# Patient Record
Sex: Male | Born: 1940 | Race: White | Hispanic: No | Marital: Married | Smoking: Never smoker
Health system: Southern US, Community
[De-identification: ages and names within clinical notes are randomized; demographics above are authoritative.]

## PROBLEM LIST (undated history)

## (undated) DIAGNOSIS — M1990 Unspecified osteoarthritis, unspecified site: Secondary | ICD-10-CM

## (undated) DIAGNOSIS — K219 Gastro-esophageal reflux disease without esophagitis: Secondary | ICD-10-CM

## (undated) DIAGNOSIS — I10 Essential (primary) hypertension: Secondary | ICD-10-CM

## (undated) HISTORY — PX: HERNIA REPAIR: SHX51

## (undated) HISTORY — PX: TONSILLECTOMY: SUR1361

---

## 2021-11-19 ENCOUNTER — Emergency Department (HOSPITAL_COMMUNITY): Payer: Medicare Other

## 2021-11-19 ENCOUNTER — Other Ambulatory Visit: Payer: Self-pay

## 2021-11-19 ENCOUNTER — Inpatient Hospital Stay (HOSPITAL_COMMUNITY)
Admission: EM | Admit: 2021-11-19 | Discharge: 2021-11-20 | DRG: 683 | Disposition: A | Discharge status: Home / Self Care | Payer: Medicare Other | Attending: Internal Medicine | Admitting: Internal Medicine

## 2021-11-19 ENCOUNTER — Encounter (HOSPITAL_COMMUNITY): Payer: Self-pay

## 2021-11-19 DIAGNOSIS — R112 Nausea with vomiting, unspecified: Secondary | ICD-10-CM | POA: Diagnosis present

## 2021-11-19 DIAGNOSIS — E861 Hypovolemia: Secondary | ICD-10-CM | POA: Diagnosis present

## 2021-11-19 DIAGNOSIS — N136 Pyonephrosis: Secondary | ICD-10-CM | POA: Diagnosis present

## 2021-11-19 DIAGNOSIS — N179 Acute kidney failure, unspecified: Principal | ICD-10-CM

## 2021-11-19 DIAGNOSIS — N320 Bladder-neck obstruction: Secondary | ICD-10-CM | POA: Diagnosis present

## 2021-11-19 DIAGNOSIS — Z79899 Other long term (current) drug therapy: Secondary | ICD-10-CM

## 2021-11-19 DIAGNOSIS — N138 Other obstructive and reflux uropathy: Secondary | ICD-10-CM | POA: Diagnosis present

## 2021-11-19 DIAGNOSIS — R109 Unspecified abdominal pain: Secondary | ICD-10-CM | POA: Diagnosis not present

## 2021-11-19 DIAGNOSIS — Z87440 Personal history of urinary (tract) infections: Secondary | ICD-10-CM

## 2021-11-19 DIAGNOSIS — K229 Disease of esophagus, unspecified: Secondary | ICD-10-CM

## 2021-11-19 DIAGNOSIS — A419 Sepsis, unspecified organism: Secondary | ICD-10-CM | POA: Diagnosis not present

## 2021-11-19 DIAGNOSIS — N390 Urinary tract infection, site not specified: Secondary | ICD-10-CM

## 2021-11-19 DIAGNOSIS — E871 Hypo-osmolality and hyponatremia: Secondary | ICD-10-CM | POA: Diagnosis present

## 2021-11-19 DIAGNOSIS — K209 Esophagitis, unspecified without bleeding: Secondary | ICD-10-CM | POA: Diagnosis not present

## 2021-11-19 DIAGNOSIS — K4090 Unilateral inguinal hernia, without obstruction or gangrene, not specified as recurrent: Secondary | ICD-10-CM | POA: Diagnosis present

## 2021-11-19 DIAGNOSIS — I10 Essential (primary) hypertension: Secondary | ICD-10-CM

## 2021-11-19 HISTORY — DX: Unspecified osteoarthritis, unspecified site: M19.90

## 2021-11-19 HISTORY — DX: Essential (primary) hypertension: I10

## 2021-11-19 LAB — CBC WITH DIFFERENTIAL/PLATELET
Abs Immature Granulocytes: 0.07 10*3/uL (ref 0.00–0.07)
Basophils Absolute: 0 10*3/uL (ref 0.0–0.1)
Basophils Relative: 0 %
Eosinophils Absolute: 0 10*3/uL (ref 0.0–0.5)
Eosinophils Relative: 0 %
HCT: 34.5 % — ABNORMAL LOW (ref 39.0–52.0)
Hemoglobin: 12 g/dL — ABNORMAL LOW (ref 13.0–17.0)
Immature Granulocytes: 1 %
Lymphocytes Relative: 3 %
Lymphs Abs: 0.4 10*3/uL — ABNORMAL LOW (ref 0.7–4.0)
MCH: 31.5 pg (ref 26.0–34.0)
MCHC: 34.8 g/dL (ref 30.0–36.0)
MCV: 90.6 fL (ref 80.0–100.0)
Monocytes Absolute: 0.7 10*3/uL (ref 0.1–1.0)
Monocytes Relative: 5 %
Neutro Abs: 12 10*3/uL — ABNORMAL HIGH (ref 1.7–7.7)
Neutrophils Relative %: 91 %
Platelets: 236 10*3/uL (ref 150–400)
RBC: 3.81 MIL/uL — ABNORMAL LOW (ref 4.22–5.81)
RDW: 12.4 % (ref 11.5–15.5)
WBC: 13.1 10*3/uL — ABNORMAL HIGH (ref 4.0–10.5)
nRBC: 0 % (ref 0.0–0.2)

## 2021-11-19 LAB — URINALYSIS, ROUTINE W REFLEX MICROSCOPIC
Bilirubin Urine: NEGATIVE
Glucose, UA: 50 mg/dL — AB
Ketones, ur: NEGATIVE mg/dL
Leukocytes,Ua: NEGATIVE
Nitrite: NEGATIVE
Protein, ur: NEGATIVE mg/dL
Specific Gravity, Urine: 1.008 (ref 1.005–1.030)
pH: 5 (ref 5.0–8.0)

## 2021-11-19 LAB — COMPREHENSIVE METABOLIC PANEL
ALT: 19 U/L (ref 0–44)
AST: 31 U/L (ref 15–41)
Albumin: 3.8 g/dL (ref 3.5–5.0)
Alkaline Phosphatase: 46 U/L (ref 38–126)
Anion gap: 18 — ABNORMAL HIGH (ref 5–15)
BUN: 80 mg/dL — ABNORMAL HIGH (ref 8–23)
CO2: 16 mmol/L — ABNORMAL LOW (ref 22–32)
Calcium: 8 mg/dL — ABNORMAL LOW (ref 8.9–10.3)
Chloride: 92 mmol/L — ABNORMAL LOW (ref 98–111)
Creatinine, Ser: 9.6 mg/dL — ABNORMAL HIGH (ref 0.61–1.24)
GFR, Estimated: 5 mL/min — ABNORMAL LOW (ref >60)
Glucose, Bld: 155 mg/dL — ABNORMAL HIGH (ref 70–99)
Potassium: 4 mmol/L (ref 3.5–5.1)
Sodium: 126 mmol/L — ABNORMAL LOW (ref 135–145)
Total Bilirubin: 0.8 mg/dL (ref 0.3–1.2)
Total Protein: 7.4 g/dL (ref 6.5–8.1)

## 2021-11-19 LAB — BASIC METABOLIC PANEL
Anion gap: 12 (ref 5–15)
BUN: 69 mg/dL — ABNORMAL HIGH (ref 8–23)
CO2: 22 mmol/L (ref 22–32)
Calcium: 8.2 mg/dL — ABNORMAL LOW (ref 8.9–10.3)
Chloride: 102 mmol/L (ref 98–111)
Creatinine, Ser: 7.72 mg/dL — ABNORMAL HIGH (ref 0.61–1.24)
GFR, Estimated: 7 mL/min — ABNORMAL LOW (ref >60)
Glucose, Bld: 93 mg/dL (ref 70–99)
Potassium: 4.1 mmol/L (ref 3.5–5.1)
Sodium: 136 mmol/L (ref 135–145)

## 2021-11-19 LAB — LIPASE, BLOOD: Lipase: 24 U/L (ref 11–51)

## 2021-11-19 MED ORDER — SODIUM CHLORIDE 0.9 % IV SOLN
1.0000 g | INTRAVENOUS | Status: DC
  Administered 2021-11-19: 1 g via INTRAVENOUS
  Filled 2021-11-19: qty 10

## 2021-11-19 MED ORDER — ACETAMINOPHEN 650 MG RE SUPP: 650.0000 mg | Freq: Four times a day (QID) | RECTAL | Status: DC | PRN

## 2021-11-19 MED ORDER — SODIUM CHLORIDE 0.9 % IV BOLUS
500.0000 mL | Freq: Once | INTRAVENOUS | Status: AC
  Administered 2021-11-19: 500 mL via INTRAVENOUS

## 2021-11-19 MED ORDER — HEPARIN SODIUM (PORCINE) 5000 UNIT/ML IJ SOLN
5000.0000 [IU] | Freq: Three times a day (TID) | INTRAMUSCULAR | Status: DC
  Administered 2021-11-19 – 2021-11-20 (×3): 5000 [IU] via SUBCUTANEOUS
  Filled 2021-11-19 (×2): qty 1

## 2021-11-19 MED ORDER — ENOXAPARIN SODIUM 40 MG/0.4ML IJ SOSY: 40.0000 mg | PREFILLED_SYRINGE | INTRAMUSCULAR | Status: DC

## 2021-11-19 MED ORDER — ONDANSETRON HCL 4 MG PO TABS: 4.0000 mg | ORAL_TABLET | Freq: Four times a day (QID) | ORAL | Status: DC | PRN

## 2021-11-19 MED ORDER — LISINOPRIL 10 MG PO TABS: 10.0000 mg | ORAL_TABLET | Freq: Every day | ORAL | Status: DC

## 2021-11-19 MED ORDER — ACETAMINOPHEN 325 MG PO TABS
650.0000 mg | ORAL_TABLET | Freq: Four times a day (QID) | ORAL | Status: DC | PRN
  Administered 2021-11-19: 650 mg via ORAL
  Filled 2021-11-19: qty 2

## 2021-11-19 MED ORDER — ONDANSETRON HCL 4 MG/2ML IJ SOLN
4.0000 mg | Freq: Once | INTRAMUSCULAR | Status: AC
  Administered 2021-11-19: 4 mg via INTRAVENOUS
  Filled 2021-11-19: qty 2

## 2021-11-19 MED ORDER — PANTOPRAZOLE SODIUM 40 MG PO TBEC
40.0000 mg | DELAYED_RELEASE_TABLET | Freq: Every day | ORAL | Status: DC
  Administered 2021-11-19 – 2021-11-20 (×2): 40 mg via ORAL
  Filled 2021-11-19 (×2): qty 1

## 2021-11-19 MED ORDER — IOHEXOL 9 MG/ML PO SOLN
ORAL | Status: AC
  Filled 2021-11-19: qty 1000

## 2021-11-19 MED ORDER — ONDANSETRON HCL 4 MG/2ML IJ SOLN: 4.0000 mg | Freq: Four times a day (QID) | INTRAMUSCULAR | Status: DC | PRN

## 2021-11-19 MED ORDER — ADULT MULTIVITAMIN W/MINERALS CH
1.0000 | ORAL_TABLET | Freq: Every day | ORAL | Status: DC
  Administered 2021-11-19 – 2021-11-20 (×2): 1 via ORAL
  Filled 2021-11-19 (×2): qty 1

## 2021-11-19 MED ORDER — SODIUM CHLORIDE 0.9 % IV SOLN: INTRAVENOUS | Status: DC

## 2021-11-19 NOTE — Assessment & Plan Note (Signed)
Complicated urinary tract infection.  Positive sepsis criteria on admission.   Patient has been taking bactrim at home.   Plan to continue antibiotic therapy with IV ceftriaxone. Follow up cell count, temperature curve and cultures.

## 2021-11-19 NOTE — ED Notes (Signed)
Patient educated on need to drink contrast at this time.

## 2021-11-19 NOTE — Progress Notes (Addendum)
Patient's foreskin is stuck behind glans of penis after cleaning d/t foley. RN x 2 attempted to reposition and was unsuccessful. Urology paged and coming to bedside.

## 2021-11-19 NOTE — ED Notes (Signed)
Attempted to call report x1. Nurse stated she would call back for report.

## 2021-11-20 DIAGNOSIS — N179 Acute kidney failure, unspecified: Secondary | ICD-10-CM | POA: Diagnosis not present

## 2021-11-20 LAB — BASIC METABOLIC PANEL
Anion gap: 8 (ref 5–15)
BUN: 50 mg/dL — ABNORMAL HIGH (ref 8–23)
CO2: 23 mmol/L (ref 22–32)
Calcium: 8.3 mg/dL — ABNORMAL LOW (ref 8.9–10.3)
Chloride: 108 mmol/L (ref 98–111)
Creatinine, Ser: 4.87 mg/dL — ABNORMAL HIGH (ref 0.61–1.24)
GFR, Estimated: 11 mL/min — ABNORMAL LOW (ref >60)
Glucose, Bld: 77 mg/dL (ref 70–99)
Potassium: 4.2 mmol/L (ref 3.5–5.1)
Sodium: 139 mmol/L (ref 135–145)

## 2021-11-20 LAB — CBC
HCT: 34.9 % — ABNORMAL LOW (ref 39.0–52.0)
Hemoglobin: 11.9 g/dL — ABNORMAL LOW (ref 13.0–17.0)
MCH: 31.4 pg (ref 26.0–34.0)
MCHC: 34.1 g/dL (ref 30.0–36.0)
MCV: 92.1 fL (ref 80.0–100.0)
Platelets: 267 10*3/uL (ref 150–400)
RBC: 3.79 MIL/uL — ABNORMAL LOW (ref 4.22–5.81)
RDW: 12.9 % (ref 11.5–15.5)
WBC: 13.4 10*3/uL — ABNORMAL HIGH (ref 4.0–10.5)
nRBC: 0 % (ref 0.0–0.2)

## 2021-11-20 MED ORDER — GUAIFENESIN 100 MG/5ML PO LIQD: 5.0000 mL | ORAL | Status: DC | PRN

## 2021-11-20 MED ORDER — TAMSULOSIN HCL 0.4 MG PO CAPS: 0.4000 mg | ORAL_CAPSULE | Freq: Every day | ORAL | 0 refills | Status: DC

## 2021-11-20 MED ORDER — METOPROLOL TARTRATE 5 MG/5ML IV SOLN: 5.0000 mg | INTRAVENOUS | Status: DC | PRN

## 2021-11-20 MED ORDER — HYDRALAZINE HCL 20 MG/ML IJ SOLN: 10.0000 mg | INTRAMUSCULAR | Status: DC | PRN

## 2021-11-20 MED ORDER — CHLORHEXIDINE GLUCONATE CLOTH 2 % EX PADS
6.0000 | MEDICATED_PAD | Freq: Every day | CUTANEOUS | Status: DC
  Administered 2021-11-20: 6 via TOPICAL

## 2021-11-20 MED ORDER — IPRATROPIUM-ALBUTEROL 0.5-2.5 (3) MG/3ML IN SOLN: 3.0000 mL | RESPIRATORY_TRACT | Status: DC | PRN

## 2021-11-20 MED ORDER — PANTOPRAZOLE SODIUM 40 MG PO TBEC: 40.0000 mg | DELAYED_RELEASE_TABLET | Freq: Every day | ORAL | 0 refills | Status: DC

## 2021-11-20 MED ORDER — TRAZODONE HCL 50 MG PO TABS: 50.0000 mg | ORAL_TABLET | Freq: Every evening | ORAL | Status: DC | PRN

## 2021-11-20 MED ORDER — SENNOSIDES-DOCUSATE SODIUM 8.6-50 MG PO TABS: 1.0000 | ORAL_TABLET | Freq: Every evening | ORAL | Status: DC | PRN

## 2021-11-20 MED ORDER — CIPROFLOXACIN HCL 500 MG PO TABS: 500.0000 mg | ORAL_TABLET | Freq: Two times a day (BID) | ORAL | 0 refills | Status: AC

## 2021-11-21 LAB — URINE CULTURE: Culture: NO GROWTH

## 2021-11-25 ENCOUNTER — Ambulatory Visit (INDEPENDENT_AMBULATORY_CARE_PROVIDER_SITE_OTHER): Payer: Medicare Other | Admitting: Physician Assistant

## 2021-11-25 VITALS — BP 124/83 | HR 109

## 2021-11-25 DIAGNOSIS — R3912 Poor urinary stream: Secondary | ICD-10-CM | POA: Diagnosis not present

## 2021-11-25 DIAGNOSIS — R339 Retention of urine, unspecified: Secondary | ICD-10-CM

## 2021-11-25 DIAGNOSIS — N179 Acute kidney failure, unspecified: Secondary | ICD-10-CM

## 2021-11-25 MED ORDER — SILODOSIN 8 MG PO CAPS: 8.0000 mg | ORAL_CAPSULE | Freq: Every day | ORAL | 0 refills | Status: DC

## 2021-11-25 NOTE — Progress Notes (Signed)
11/25/2021 10:43 AM   James Vazquez [birth year] [id number]   Assessment:  1. Urinary retention  2. AKI (acute kidney injury) (HCC)  3. Weak urine stream    Plan: Patient return to the office mid afternoon following voiding trial and has been unable to void.  Foley replaced and prescription for Rapaflo sent to his pharmacy.  We will discontinue the tamsulosin at this time and start silodosin daily.  Follow-up next week for reattempt at voiding trial.  Possible cystoscopy BMP has been ordered by the patient's primary care provider with whom he follows up next week  Chief Complaint: No chief complaint on file.   Referring provider: Alvina Filbert, MD [address] [place],  [place] [address]   History of Present Illness:  James Vazquez is a 81 y.o. year old male with history of hypertension and no other reported medical problems who is seen in consultation from James Filbert, MD for voiding trial and evaluation of acute urinary retention with resulting acute renal failure as diagnosed in the emergency department on 11/19/2021. Foley was placed with >1L of urine noted in bladder and pt was admitted. Urine cx on 6/23-no growth and CT showed B hydro with bladder wall thickening. Consult visit with Dr. Ronne Binning and pt placed on Flomax. The pt denies h/o urinary c/o except some weakness of stream and intermittency with an increase in frequency for the last 4 months.  Nocturia 0-3 times.  He has no history of stone disease, states he has never been diagnosed with BPH, has no history of UTIs and denies hematuria.  Follow-up visit is scheduled with his primary care provider on July 10 and BMP scheduled. DC Creatinine 4.87, down from 9.6 at admission. Pt remains on Cipro as Rx at hospital DC.  11/20/21 81 year old male with history of essential hypertension, osteoarthritis admitted for nausea vomiting and abdominal pain.  He was diagnosed with UTI on 6/19.  Upon admission he was noted to  have urinary retention of about 1 L and acute kidney injury with creatinine of 9.6.  Foley catheter was placed.  He was also hyponatremic.  IV fluids were started.  Over the course of 24 hours renal function significantly started improving and this morning it was 4.87.  He was seen by urology and cleared for discharge with Foley catheter and recommendations as stated above.    Portions of the above documentation were copied from a prior visit for review purposes only  Past Medical History:  Past Medical History:  Diagnosis Date   Arthritis    Hypertension     Past Surgical History:  Past Surgical History:  Procedure Laterality Date   HERNIA REPAIR     TONSILLECTOMY      Allergies:  No Known Allergies  Family History:  No family history on file.  Social History:  Social History   Tobacco Use   Smoking status: Never   Smokeless tobacco: Never  Vaping Use   Vaping Use: Never used  Substance Use Topics   Alcohol use: Never   Drug use: Never    Review of symptoms:  Constitutional:  Negative for unexplained weight loss, night sweats, fever, chills ENT:  Negative for nose bleeds, sinus pain, painful swallowing CV:  Negative for chest pain, shortness of breath, exercise intolerance, palpitations, loss of consciousness Resp:  Negative for cough, wheezing, shortness of breath GI:  Negative for nausea, vomiting, diarrhea, bloody stools GU:  Positives noted in HPI; otherwise negative for gross hematuria,  dysuria, urinary incontinence Neuro:  Negative for seizures, poor balance, limb weakness, slurred speech Psych:  Negative for lack of energy, depression, anxiety Endocrine:  Negative for polydipsia, polyuria, symptoms of hypoglycemia (dizziness, hunger, sweating) Hematologic:  Negative for anemia, purpura, petechia, prolonged or excessive bleeding, use of anticoagulants   Physical Exam: BP 124/83   Pulse (!) 109   Constitutional:  Alert and oriented, No acute distress. HEENT:  NCAT, moist mucus membranes.  Trachea midline, no masses. Cardiovascular: Regular rate and rhythm without murmur, rub, or gallops No clubbing, cyanosis, or edema. Respiratory: Normal respiratory effort, clear to auscultation bilaterally GI: Abdomen is soft, nontender, nondistended, no abdominal masses BACK:  Non-tender to palpation.  No CVAT Skin: No obvious rashes, warm, dry, intact Neurologic: Alert and oriented, Cranial nerves grossly intact, no focal deficits, moving all 4 extremities. Psychiatric: Appropriate. Normal mood and affect.  Laboratory Data: No results found for this or any previous visit (from the past 24 hour(s)).  Lab Results  Component Value Date   WBC 13.4 (H) 11/20/2021   HGB 11.9 (L) 11/20/2021   HCT 34.9 (L) 11/20/2021   MCV 92.1 11/20/2021   PLT 267 11/20/2021    Lab Results  Component Value Date   CREATININE 4.87 (H) 11/20/2021    Urinalysis    Component Value Date/Time   COLORURINE YELLOW 11/19/2021 0800   APPEARANCEUR CLEAR 11/19/2021 0800   LABSPEC 1.008 11/19/2021 0800   PHURINE 5.0 11/19/2021 0800   GLUCOSEU 50 (A) 11/19/2021 0800   HGBUR SMALL (A) 11/19/2021 0800   BILIRUBINUR NEGATIVE 11/19/2021 0800   KETONESUR NEGATIVE 11/19/2021 0800   PROTEINUR NEGATIVE 11/19/2021 0800   NITRITE NEGATIVE 11/19/2021 0800   LEUKOCYTESUR NEGATIVE 11/19/2021 0800    Lab Results  Component Value Date   BACTERIA RARE (A) 11/19/2021    Pertinent Imaging: No results found for this or any previous visit.  No results found for this or any previous visit.     [place], Regan Rakers, PA-C [facility]

## 2021-11-25 NOTE — Progress Notes (Signed)
Fill and Pull Catheter Removal  Patient is present today for a catheter removal.  Patient was cleaned and prepped in a sterile fashion of sterile water/ saline was instilled into the bladder when the patient felt the urge to urinate. 24ml of water was then drained from the balloon.  A 16FR foley cath was removed from the bladder no complications were noted .  Patient as then given some time to void on their own.  Patient cannot void  59ml on their own after some time.  Patient tolerated well.  Performed by: Marchelle Folks RN  Follow up/ Additional notes: PA to see- patient will come back later for PVR

## 2021-12-01 ENCOUNTER — Ambulatory Visit (INDEPENDENT_AMBULATORY_CARE_PROVIDER_SITE_OTHER): Payer: Medicare Other | Admitting: Physician Assistant

## 2021-12-01 VITALS — BP 144/81 | HR 111 | Ht 72.0 in | Wt 160.0 lb

## 2021-12-01 DIAGNOSIS — R339 Retention of urine, unspecified: Secondary | ICD-10-CM

## 2021-12-01 DIAGNOSIS — N179 Acute kidney failure, unspecified: Secondary | ICD-10-CM | POA: Diagnosis not present

## 2021-12-01 LAB — BLADDER SCAN AMB NON-IMAGING: Scan Result: 904

## 2021-12-01 NOTE — Progress Notes (Signed)
Assessment: 1. Urinary retention  2. AKI (acute kidney injury) (HCC)    Plan: As the patient was unable to void during office visit, he is encouraged to increase his fluid intake and attempt to void throughout the morning.  He will follow-up in the office around 2 this afternoon if he continues to have voiding issues.  Follow-up next week for cystoscopic exam regardless of today's outcome.  He will continue silodosin. FU BMP scheduled   Chief Complaint: No chief complaint on file.   HPI: James Vazquez is a 81 y.o. male who presents for continued evaluation of acute urinary retention with renal injury diagnosed at hospital admission on 11/19/2021. The patient was seen last week for voiding trial and was unable to void by mid afternoon following procedure.  Foley was reinserted and he presents today for further attempt.  He has been taking Rapaflo daily since last visit.  Follow-up BMP scheduled with his primary care provider  11/25/21 James Vazquez is a 81 y.o. year old male with history of hypertension and no other reported medical problems who is seen in consultation from Alvina Filbert, MD for voiding trial and evaluation of acute urinary retention with resulting acute renal failure as diagnosed in the emergency department on 11/19/2021. Foley was placed with >1L of urine noted in bladder and pt was admitted. Urine cx on 6/23-no growth and CT showed B hydro with bladder wall thickening. Consult visit with Dr. Ronne Binning and pt placed on Flomax. The pt denies h/o urinary c/o except some weakness of stream and intermittency with an increase in frequency for the last 4 months.  Nocturia 0-3 times.  He has no history of stone disease, states he has never been diagnosed with BPH, has no history of UTIs and denies hematuria.  Follow-up visit is scheduled with his primary care provider on July 10 and BMP scheduled. DC Creatinine 4.87, down from 9.6 at admission. Pt remains on Cipro as Rx at hospital  DC.   11/20/21 Hospital consult: 81 year old male with history of essential hypertension, osteoarthritis admitted for nausea vomiting and abdominal pain.  He was diagnosed with UTI on 6/19.  Upon admission he was noted to have urinary retention of about 1 L and acute kidney injury with creatinine of 9.6.  Foley catheter was placed.  He was also hyponatremic.  IV fluids were started.  Over the course of 24 hours renal function significantly started improving and this morning it was 4.87.  He was seen by urology and cleared for discharge with Foley catheter and recommendations as stated above.    Portions of the above documentation were copied from a prior visit for review purposes only.  Allergies: No Known Allergies  PMH: Past Medical History:  Diagnosis Date   Arthritis    Hypertension     PSH: Past Surgical History:  Procedure Laterality Date   HERNIA REPAIR     TONSILLECTOMY      SH: Social History   Tobacco Use   Smoking status: Never   Smokeless tobacco: Never  Vaping Use   Vaping Use: Never used  Substance Use Topics   Alcohol use: Never   Drug use: Never    ROS: All other review of systems were reviewed and are negative except what is noted above in HPI  PE: BP (!) 144/81   Pulse (!) 111   Ht 6' (1.829 m)   Wt 160 lb (72.6 kg)   BMI 21.70 kg/m  GENERAL APPEARANCE:  Well  appearing, well developed, well nourished, NAD HEENT:  Atraumatic, normocephalic NECK:  Supple. Trachea midline ABDOMEN:  Soft, non-tender, no masses EXTREMITIES:  Moves all extremities well NEUROLOGIC:  Alert and oriented x 3, normal gait, CN II-XII grossly intact MENTAL STATUS:  appropriate BACK:  Non-tender to palpation, No CVAT SKIN:  Warm, dry, and intact   Results: Laboratory Data: Lab Results  Component Value Date   WBC 13.4 (H) 11/20/2021   HGB 11.9 (L) 11/20/2021   HCT 34.9 (L) 11/20/2021   MCV 92.1 11/20/2021   PLT 267 11/20/2021    Lab Results  Component Value  Date   CREATININE 4.87 (H) 11/20/2021    Urinalysis    Component Value Date/Time   COLORURINE YELLOW 11/19/2021 0800   APPEARANCEUR CLEAR 11/19/2021 0800   LABSPEC 1.008 11/19/2021 0800   PHURINE 5.0 11/19/2021 0800   GLUCOSEU 50 (A) 11/19/2021 0800   HGBUR SMALL (A) 11/19/2021 0800   BILIRUBINUR NEGATIVE 11/19/2021 0800   KETONESUR NEGATIVE 11/19/2021 0800   PROTEINUR NEGATIVE 11/19/2021 0800   NITRITE NEGATIVE 11/19/2021 0800   LEUKOCYTESUR NEGATIVE 11/19/2021 0800    Lab Results  Component Value Date   BACTERIA RARE (A) 11/19/2021    Pertinent Imaging: No results found for this or any previous visit.  No results found for this or any previous visit.  No results found for this or any previous visit.  No results found for this or any previous visit.  No results found for this or any previous visit.  No results found for this or any previous visit.  No results found for this or any previous visit.  No results found for this or any previous visit.  No results found for this or any previous visit (from the past 24 hour(s)).

## 2021-12-01 NOTE — Progress Notes (Signed)
Fill and Pull Catheter Removal  Patient is present today for a catheter removal.  Patient was cleaned and prepped in a sterile fashion of sterile water/ saline was instilled into the bladder when the patient felt the urge to urinate. 67ml of water was then drained from the balloon.  A 16FR foley cath was removed from the bladder no complications were noted .  Patient as then given some time to void on their own.  Patient cannot void  29ml on their own after some time.  Patient tolerated well.  Performed by: Guss Bunde, CMA  Follow up/ Additional notes: Return for PVR later today.   post void residual =965mL  Simple Catheter Placement  Due to urinary retention patient is present today for a foley cath placement.  Patient was cleaned and prepped in a sterile fashion with betadine. A 16 FR foley catheter was inserted, urine return was noted  , urine was yellow in color.  The balloon was filled with 10cc of sterile water.  A leg bag was attached for drainage. Patient was also given a night bag to take home and was given instruction on how to change from one bag to another.  Patient was given instruction on proper catheter care.  Patient tolerated well, no complications were noted   Performed by: Guss Bunde, CMA  Additional notes/ Follow up: Follow up with MD

## 2021-12-08 ENCOUNTER — Encounter: Payer: Self-pay | Admitting: Urology

## 2021-12-08 ENCOUNTER — Ambulatory Visit (INDEPENDENT_AMBULATORY_CARE_PROVIDER_SITE_OTHER): Payer: Medicare Other | Admitting: Urology

## 2021-12-08 VITALS — BP 132/63 | HR 96

## 2021-12-08 DIAGNOSIS — R339 Retention of urine, unspecified: Secondary | ICD-10-CM | POA: Diagnosis not present

## 2021-12-08 MED ORDER — CIPROFLOXACIN HCL 500 MG PO TABS: 500.0000 mg | ORAL_TABLET | Freq: Once | ORAL | Status: DC

## 2021-12-08 NOTE — H&P (View-Only) (Signed)
   12/08/21  CC: urinary retention   HPI: James Vazquez is a 80yo here for followup for urinary retention Blood pressure 132/63, pulse 96. NED. A&Ox3.   No respiratory distress   Abd soft, NT, ND Normal phallus with bilateral descended testicles  Cystoscopy Procedure Note  Patient identification was confirmed, informed consent was obtained, and patient was prepped using Betadine solution.  Lidocaine jelly was administered per urethral meatus.     Pre-Procedure: - Inspection reveals a normal caliber ureteral meatus.  Procedure: The flexible cystoscope was introduced without difficulty - No urethral strictures/lesions are present. 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 After discussing the options the patient has elected to proceed with Urolift. Risks/benefits/alternatives discussed.   No follow-ups on file.  James Vrooman, MD  

## 2021-12-08 NOTE — Progress Notes (Signed)
   12/08/21  CC: urinary retention   HPI: James Vazquez is a 80yo here for followup for urinary retention Blood pressure 132/63, pulse 96. NED. A&Ox3.   No respiratory distress   Abd soft, NT, ND Normal phallus with bilateral descended testicles  Cystoscopy Procedure Note  Patient identification was confirmed, informed consent was obtained, and patient was prepped using Betadine solution.  Lidocaine jelly was administered per urethral meatus.     Pre-Procedure: - Inspection reveals a normal caliber ureteral meatus.  Procedure: The flexible cystoscope was introduced without difficulty - No urethral strictures/lesions are present. 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 After discussing the options the patient has elected to proceed with Urolift. Risks/benefits/alternatives discussed.   No follow-ups on file.  James Aye, MD

## 2021-12-08 NOTE — Patient Instructions (Signed)

## 2021-12-20 NOTE — Patient Instructions (Signed)
Your procedure is scheduled on: 7/312023  Report to [facility] at  7:15   AM.  Call this number if you have problems the morning of surgery: [phone]   Remember:   Do not Eat or Drink after midnight         No Smoking the morning of surgery  :  Take these medicines the morning of surgery with A SIP OF WATER: Pantoprazole, and rapaflo   Do not wear jewelry, make-up or nail polish.  Do not wear lotions, powders, or perfumes. You may wear deodorant.  Do not shave 48 hours prior to surgery. Men may shave face and neck.  Do not bring valuables to the hospital.  Contacts, dentures or bridgework may not be worn into surgery.  Leave suitcase in the car. After surgery it may be brought to your room.  For patients admitted to the hospital, checkout time is 11:00 AM the day of discharge.   Patients discharged the day of surgery will not be allowed to drive home.    Special Instructions: Shower using CHG night before surgery and shower the day of surgery use CHG.  Use special wash - you have one bottle of CHG for all showers.  You should use approximately 1/2 of the bottle for each shower.  How to Use Chlorhexidine for Bathing Chlorhexidine gluconate (CHG) is a germ-killing (antiseptic) solution that is used to clean the skin. It can get rid of the bacteria that normally live on the skin and can keep them away for about 24 hours. To clean your skin with CHG, you may be given: A CHG solution to use in the shower or as part of a sponge bath. A prepackaged cloth that contains CHG. Cleaning your skin with CHG may help lower the risk for infection: While you are staying in the intensive care unit of the hospital. If you have a vascular access, such as a central line, to provide short-term or long-term access to your veins. If you have a catheter to drain urine from your bladder. If you are on a ventilator. A ventilator is a machine that helps you breathe by moving air in and out of  your lungs. After surgery. What are the risks? Risks of using CHG include: A skin reaction. Hearing loss, if CHG gets in your ears and you have a perforated eardrum. Eye injury, if CHG gets in your eyes and is not rinsed out. The CHG product catching fire. Make sure that you avoid smoking and flames after applying CHG to your skin. Do not use CHG: If you have a chlorhexidine allergy or have previously reacted to chlorhexidine. On babies younger than 39 months of age. How to use CHG solution Use CHG only as told by your health care provider, and follow the instructions on the label. Use the full amount of CHG as directed. Usually, this is one bottle. During a shower Follow these steps when using CHG solution during a shower (unless your health care provider gives you different instructions): Start the shower. Use your normal soap and shampoo to wash your face and hair. Turn off the shower or move out of the shower stream. Pour the CHG onto a clean washcloth. Do not use any type of brush or rough-edged sponge. Starting at your neck, lather your body down to your toes. Make sure you follow these instructions: If you will be having surgery, pay special attention to the part of your body where you will be having  surgery. Scrub this area for at least 1 minute. Do not use CHG on your head or face. If the solution gets into your ears or eyes, rinse them well with water. Avoid your genital area. Avoid any areas of skin that have broken skin, cuts, or scrapes. Scrub your back and under your arms. Make sure to wash skin folds. Let the lather sit on your skin for 1-2 minutes or as long as told by your health care provider. Thoroughly rinse your entire body in the shower. Make sure that all body creases and crevices are rinsed well. Dry off with a clean towel. Do not put any substances on your body afterward--such as powder, lotion, or perfume--unless you are told to do so by your health care  provider. Only use lotions that are recommended by the manufacturer. Put on clean clothes or pajamas. If it is the night before your surgery, sleep in clean sheets.  During a sponge bath Follow these steps when using CHG solution during a sponge bath (unless your health care provider gives you different instructions): Use your normal soap and shampoo to wash your face and hair. Pour the CHG onto a clean washcloth. Starting at your neck, lather your body down to your toes. Make sure you follow these instructions: If you will be having surgery, pay special attention to the part of your body where you will be having surgery. Scrub this area for at least 1 minute. Do not use CHG on your head or face. If the solution gets into your ears or eyes, rinse them well with water. Avoid your genital area. Avoid any areas of skin that have broken skin, cuts, or scrapes. Scrub your back and under your arms. Make sure to wash skin folds. Let the lather sit on your skin for 1-2 minutes or as long as told by your health care provider. Using a different clean, wet washcloth, thoroughly rinse your entire body. Make sure that all body creases and crevices are rinsed well. Dry off with a clean towel. Do not put any substances on your body afterward--such as powder, lotion, or perfume--unless you are told to do so by your health care provider. Only use lotions that are recommended by the manufacturer. Put on clean clothes or pajamas. If it is the night before your surgery, sleep in clean sheets. How to use CHG prepackaged cloths Only use CHG cloths as told by your health care provider, and follow the instructions on the label. Use the CHG cloth on clean, dry skin. Do not use the CHG cloth on your head or face unless your health care provider tells you to. When washing with the CHG cloth: Avoid your genital area. Avoid any areas of skin that have broken skin, cuts, or scrapes. Before surgery Follow these steps  when using a CHG cloth to clean before surgery (unless your health care provider gives you different instructions): Using the CHG cloth, vigorously scrub the part of your body where you will be having surgery. Scrub using a back-and-forth motion for 3 minutes. The area on your body should be completely wet with CHG when you are done scrubbing. Do not rinse. Discard the cloth and let the area air-dry. Do not put any substances on the area afterward, such as powder, lotion, or perfume. Put on clean clothes or pajamas. If it is the night before your surgery, sleep in clean sheets.  For general bathing Follow these steps when using CHG cloths for general bathing (unless your health  care provider gives you different instructions). Use a separate CHG cloth for each area of your body. Make sure you wash between any folds of skin and between your fingers and toes. Wash your body in the following order, switching to a new cloth after each step: The front of your neck, shoulders, and chest. Both of your arms, under your arms, and your hands. Your stomach and groin area, avoiding the genitals. Your right leg and foot. Your left leg and foot. The back of your neck, your back, and your buttocks. Do not rinse. Discard the cloth and let the area air-dry. Do not put any substances on your body afterward--such as powder, lotion, or perfume--unless you are told to do so by your health care provider. Only use lotions that are recommended by the manufacturer. Put on clean clothes or pajamas. Contact a health care provider if: Your skin gets irritated after scrubbing. You have questions about using your solution or cloth. You swallow any chlorhexidine. Call your local poison control center [phone] [phone] in the [place] Get help right away if: Your eyes itch badly, or they become very red or swollen. Your skin itches badly and is red or swollen. Your hearing changes. You have trouble seeing. You have swelling or  tingling in your mouth or throat. You have trouble breathing. These symptoms may represent a serious problem that is an emergency. Do not wait to see if the symptoms will go away. Get medical help right away. Call your local emergency services (911 in the [place] Do not drive yourself to the hospital. Summary Chlorhexidine gluconate (CHG) is a germ-killing (antiseptic) solution that is used to clean the skin. Cleaning your skin with CHG may help to lower your risk for infection. You may be given CHG to use for bathing. It may be in a bottle or in a prepackaged cloth to use on your skin. Carefully follow your health care provider's instructions and the instructions on the product label. Do not use CHG if you have a chlorhexidine allergy. Contact your health care provider if your skin gets irritated after scrubbing. This information is not intended to replace advice given to you by your health care provider. Make sure you discuss any questions you have with your health care provider. Document Revised: 07/27/2020 Document Reviewed: 07/27/2020 Elsevier Patient Education  2023 [employer] Prostatic Urethral Lift, Care After The following information offers guidance on how to care for yourself after your procedure. Your health care provider may also give you more specific instructions. If you have problems or questions, contact your health care provider. What can I expect after the procedure? After the procedure, it is common to have: Soreness or discomfort in your penis from having the cystoscope inserted during the procedure. Discomfort or burning when urinating. An increased urge to urinate. More frequent urination. Urine that is blood-tinged. These symptoms should go away after a few days. Follow these instructions at home: Activity  If you were given a sedative during the procedure, it can affect you for several hours. Do not drive or operate machinery until your health care provider says  that it is safe. Avoid sitting for a long time without moving. Get up to take short walks every 1-2 hours. This is important to improve blood flow and breathing. Ask for help if you feel weak or unsteady. You may have to avoid lifting. Ask your health care provider how much you can safely lift. Avoid intense physical activity for as long as told by your  health care provider. Return to your normal activities as told by your health care provider. Ask your health care provider what activities are safe for you. Ask when you can return to sexual activity. General instructions  Take over-the-counter and prescription medicines only as told by your health care provider. Ask your health care provider if the medicine prescribed to you: Requires you to avoid driving or using machinery. Can cause constipation. You may need to take these actions to prevent or treat constipation: Drink enough fluid to keep your urine pale yellow. Take over-the-counter or prescription medicines. Eat foods that are high in fiber, such as beans, whole grains, and fresh fruits and vegetables. Limit foods that are high in fat and processed sugars, such as fried or sweet foods. Do not use any products that contain nicotine or tobacco. These products include cigarettes, chewing tobacco, and vaping devices, such as e-cigarettes. These can delay healing after the procedure. If you need help quitting, ask your health care provider. Keep all follow-up visits. This is important. Contact a health care provider if: You have chills or a fever. You have pain when passing urine. You have bright red blood or blood clots in your urine. You have difficulty passing urine. You have leaking of urine (incontinence). Get help right away if: You have chest pain or shortness of breath. You have leg pain or swelling. You cannot pass urine. These symptoms may be an emergency. Get help right away. Call 911. Do not wait to see if the symptoms will  go away. Do not drive yourself to the hospital. Summary After the procedure, it is common to have discomfort or burning when urinating, an increased urge to urinate, more frequent urination, and urine that is blood-tinged. You may have to avoid lifting. Ask your health care provider how much you can safely lift. Return to your normal activities as told by your health care provider. Ask when you can return to sexual activity. This information is not intended to replace advice given to you by your health care provider. Make sure you discuss any questions you have with your health care provider. Document Revised: 12/11/2020 Document Reviewed: 12/11/2020 Elsevier Patient Education  2023 [employer]. General Anesthesia, Adult, Care After This sheet gives you information about how to care for yourself after your procedure. Your health care provider may also give you more specific instructions. If you have problems or questions, contact your health care provider. What can I expect after the procedure? After the procedure, the following side effects are common: Pain or discomfort at the IV site. Nausea. Vomiting. Sore throat. Trouble concentrating. Feeling cold or chills. Feeling weak or tired. Sleepiness and fatigue. Soreness and body aches. These side effects can affect parts of the body that were not involved in surgery. Follow these instructions at home: For the time period you were told by your health care provider:  Rest. Do not participate in activities where you could fall or become injured. Do not drive or use machinery. Do not drink alcohol. Do not take sleeping pills or medicines that cause drowsiness. Do not make important decisions or sign legal documents. Do not take care of children on your own. Eating and drinking Follow any instructions from your health care provider about eating or drinking restrictions. When you feel hungry, start by eating small amounts of foods that  are soft and easy to digest (bland), such as toast. Gradually return to your regular diet. Drink enough fluid to keep your urine pale yellow.  If you vomit, rehydrate by drinking water, juice, or clear broth. General instructions If you have sleep apnea, surgery and certain medicines can increase your risk for breathing problems. Follow instructions from your health care provider about wearing your sleep device: Anytime you are sleeping, including during daytime naps. While taking prescription pain medicines, sleeping medicines, or medicines that make you drowsy. Have a responsible adult stay with you for the time you are told. It is important to have someone help care for you until you are awake and alert. Return to your normal activities as told by your health care provider. Ask your health care provider what activities are safe for you. Take over-the-counter and prescription medicines only as told by your health care provider. If you smoke, do not smoke without supervision. Keep all follow-up visits as told by your health care provider. This is important. Contact a health care provider if: You have nausea or vomiting that does not get better with medicine. You cannot eat or drink without vomiting. You have pain that does not get better with medicine. You are unable to pass urine. You develop a skin rash. You have a fever. You have redness around your IV site that gets worse. Get help right away if: You have difficulty breathing. You have chest pain. You have blood in your urine or stool, or you vomit blood. Summary After the procedure, it is common to have a sore throat or nausea. It is also common to feel tired. Have a responsible adult stay with you for the time you are told. It is important to have someone help care for you until you are awake and alert. When you feel hungry, start by eating small amounts of foods that are soft and easy to digest (bland), such as toast. Gradually  return to your regular diet. Drink enough fluid to keep your urine pale yellow. Return to your normal activities as told by your health care provider. Ask your health care provider what activities are safe for you. This information is not intended to replace advice given to you by your health care provider. Make sure you discuss any questions you have with your health care provider. Document Revised: 01/30/2020 Document Reviewed: 08/29/2019 Elsevier Patient Education  [place].

## 2021-12-22 ENCOUNTER — Encounter (HOSPITAL_COMMUNITY): Payer: Self-pay

## 2021-12-22 ENCOUNTER — Encounter (HOSPITAL_COMMUNITY)
Admission: RE | Admit: 2021-12-22 | Discharge: 2021-12-22 | Disposition: A | Discharge status: Home / Self Care | Payer: Medicare Other | Source: Ambulatory Visit | Attending: Urology | Admitting: Urology

## 2021-12-22 VITALS — BP 134/68 | HR 92 | Temp 98.6°F | Resp 18 | Ht 72.0 in | Wt 160.0 lb

## 2021-12-22 DIAGNOSIS — D649 Anemia, unspecified: Secondary | ICD-10-CM | POA: Diagnosis not present

## 2021-12-22 DIAGNOSIS — N179 Acute kidney failure, unspecified: Secondary | ICD-10-CM | POA: Diagnosis not present

## 2021-12-22 DIAGNOSIS — I10 Essential (primary) hypertension: Secondary | ICD-10-CM | POA: Diagnosis not present

## 2021-12-22 DIAGNOSIS — Z01818 Encounter for other preprocedural examination: Secondary | ICD-10-CM | POA: Diagnosis present

## 2021-12-22 HISTORY — DX: Gastro-esophageal reflux disease without esophagitis: K21.9

## 2021-12-22 LAB — CBC
HCT: 35.5 % — ABNORMAL LOW (ref 39.0–52.0)
Hemoglobin: 11.8 g/dL — ABNORMAL LOW (ref 13.0–17.0)
MCH: 31.4 pg (ref 26.0–34.0)
MCHC: 33.2 g/dL (ref 30.0–36.0)
MCV: 94.4 fL (ref 80.0–100.0)
Platelets: 278 10*3/uL (ref 150–400)
RBC: 3.76 MIL/uL — ABNORMAL LOW (ref 4.22–5.81)
RDW: 12.6 % (ref 11.5–15.5)
WBC: 8.2 10*3/uL (ref 4.0–10.5)
nRBC: 0 % (ref 0.0–0.2)

## 2021-12-22 LAB — BASIC METABOLIC PANEL
Anion gap: 6 (ref 5–15)
BUN: 20 mg/dL (ref 8–23)
CO2: 27 mmol/L (ref 22–32)
Calcium: 8.8 mg/dL — ABNORMAL LOW (ref 8.9–10.3)
Chloride: 106 mmol/L (ref 98–111)
Creatinine, Ser: 1.11 mg/dL (ref 0.61–1.24)
GFR, Estimated: >60 mL/min (ref >60)
Glucose, Bld: 162 mg/dL — ABNORMAL HIGH (ref 70–99)
Potassium: 3.7 mmol/L (ref 3.5–5.1)
Sodium: 139 mmol/L (ref 135–145)

## 2021-12-27 ENCOUNTER — Encounter (HOSPITAL_COMMUNITY)
Admission: RE | Disposition: A | Discharge status: Home / Self Care | Payer: Self-pay | Source: Home / Self Care | Attending: Urology

## 2021-12-27 ENCOUNTER — Ambulatory Visit (HOSPITAL_BASED_OUTPATIENT_CLINIC_OR_DEPARTMENT_OTHER): Payer: Medicare Other | Admitting: Anesthesiology

## 2021-12-27 ENCOUNTER — Ambulatory Visit (HOSPITAL_COMMUNITY): Payer: Medicare Other | Admitting: Anesthesiology

## 2021-12-27 ENCOUNTER — Encounter (HOSPITAL_COMMUNITY): Payer: Self-pay | Admitting: Urology

## 2021-12-27 ENCOUNTER — Ambulatory Visit (HOSPITAL_COMMUNITY)
Admission: RE | Admit: 2021-12-27 | Discharge: 2021-12-27 | Disposition: A | Discharge status: Home / Self Care | Payer: Medicare Other | Attending: Urology | Admitting: Urology

## 2021-12-27 DIAGNOSIS — N138 Other obstructive and reflux uropathy: Secondary | ICD-10-CM | POA: Diagnosis not present

## 2021-12-27 DIAGNOSIS — R338 Other retention of urine: Secondary | ICD-10-CM

## 2021-12-27 DIAGNOSIS — K219 Gastro-esophageal reflux disease without esophagitis: Secondary | ICD-10-CM | POA: Insufficient documentation

## 2021-12-27 DIAGNOSIS — N401 Enlarged prostate with lower urinary tract symptoms: Secondary | ICD-10-CM | POA: Insufficient documentation

## 2021-12-27 DIAGNOSIS — I10 Essential (primary) hypertension: Secondary | ICD-10-CM | POA: Diagnosis not present

## 2021-12-27 HISTORY — PX: CYSTOSCOPY WITH INSERTION OF UROLIFT: SHX6678

## 2021-12-27 SURGERY — CYSTOSCOPY WITH INSERTION OF UROLIFT
Anesthesia: General | Site: Prostate

## 2021-12-27 MED ORDER — PHENYLEPHRINE 80 MCG/ML (10ML) SYRINGE FOR IV PUSH (FOR BLOOD PRESSURE SUPPORT)
PREFILLED_SYRINGE | INTRAVENOUS | Status: DC | PRN
  Administered 2021-12-27 (×5): 80 ug via INTRAVENOUS

## 2021-12-27 MED ORDER — LIDOCAINE HCL URETHRAL/MUCOSAL 2 % EX GEL
CUTANEOUS | Status: AC
  Filled 2021-12-27: qty 10

## 2021-12-27 MED ORDER — ONDANSETRON HCL 4 MG/2ML IJ SOLN
INTRAMUSCULAR | Status: AC
  Filled 2021-12-27: qty 2

## 2021-12-27 MED ORDER — PROPOFOL 10 MG/ML IV BOLUS
INTRAVENOUS | Status: DC | PRN
  Administered 2021-12-27: 140 mg via INTRAVENOUS
  Administered 2021-12-27: 30 mg via INTRAVENOUS

## 2021-12-27 MED ORDER — ORAL CARE MOUTH RINSE: 15.0000 mL | Freq: Once | OROMUCOSAL | Status: AC

## 2021-12-27 MED ORDER — LIDOCAINE HCL (PF) 2 % IJ SOLN
INTRAMUSCULAR | Status: AC
  Filled 2021-12-27: qty 5

## 2021-12-27 MED ORDER — CHLORHEXIDINE GLUCONATE 0.12 % MT SOLN
15.0000 mL | Freq: Once | OROMUCOSAL | Status: AC
  Administered 2021-12-27: 15 mL via OROMUCOSAL

## 2021-12-27 MED ORDER — FENTANYL CITRATE (PF) 100 MCG/2ML IJ SOLN
INTRAMUSCULAR | Status: DC | PRN
  Administered 2021-12-27 (×2): 25 ug via INTRAVENOUS

## 2021-12-27 MED ORDER — PROPOFOL 10 MG/ML IV BOLUS
INTRAVENOUS | Status: AC
  Filled 2021-12-27: qty 20

## 2021-12-27 MED ORDER — CEFAZOLIN SODIUM-DEXTROSE 2-4 GM/100ML-% IV SOLN
2.0000 g | INTRAVENOUS | Status: AC
  Administered 2021-12-27: 2 g via INTRAVENOUS
  Filled 2021-12-27: qty 100

## 2021-12-27 MED ORDER — LACTATED RINGERS IV SOLN
INTRAVENOUS | Status: DC
Start: 2021-12-27 — End: 2021-12-27

## 2021-12-27 MED ORDER — FENTANYL CITRATE (PF) 100 MCG/2ML IJ SOLN
INTRAMUSCULAR | Status: AC
  Filled 2021-12-27: qty 2

## 2021-12-27 MED ORDER — ONDANSETRON HCL 4 MG/2ML IJ SOLN: 4.0000 mg | Freq: Once | INTRAMUSCULAR | Status: DC | PRN

## 2021-12-27 MED ORDER — ONDANSETRON HCL 4 MG/2ML IJ SOLN
INTRAMUSCULAR | Status: DC | PRN
  Administered 2021-12-27: 4 mg via INTRAVENOUS

## 2021-12-27 MED ORDER — WATER FOR IRRIGATION, STERILE IR SOLN
Status: DC | PRN
  Administered 2021-12-27: 3000 mL

## 2021-12-27 MED ORDER — LIDOCAINE 2% (20 MG/ML) 5 ML SYRINGE
INTRAMUSCULAR | Status: DC | PRN
  Administered 2021-12-27: 80 mg via INTRAVENOUS

## 2021-12-27 MED ORDER — FENTANYL CITRATE PF 50 MCG/ML IJ SOSY: 25.0000 ug | PREFILLED_SYRINGE | INTRAMUSCULAR | Status: DC | PRN

## 2021-12-27 MED ORDER — OXYCODONE HCL 5 MG/5ML PO SOLN: 5.0000 mg | Freq: Once | ORAL | Status: DC | PRN

## 2021-12-27 MED ORDER — OXYCODONE HCL 5 MG PO TABS: 5.0000 mg | ORAL_TABLET | Freq: Once | ORAL | Status: DC | PRN

## 2021-12-27 SURGICAL SUPPLY — 19 items
BAG DRAIN URO TABLE W/ADPT NS (BAG) ×2 IMPLANT
BAG DRN 8 ADPR NS SKTRN CSTL (BAG) ×1
BAG HAMPER (MISCELLANEOUS) ×2 IMPLANT
CLOTH BEACON ORANGE TIMEOUT ST (SAFETY) ×2 IMPLANT
GLOVE BIO SURGEON STRL SZ8 (GLOVE) ×2 IMPLANT
GLOVE BIOGEL PI IND STRL 7.0 (GLOVE) ×2 IMPLANT
GLOVE BIOGEL PI INDICATOR 7.0 (GLOVE) ×2
GOWN STRL REUS W/TWL LRG LVL3 (GOWN DISPOSABLE) ×2 IMPLANT
GOWN STRL REUS W/TWL XL LVL3 (GOWN DISPOSABLE) ×2 IMPLANT
KIT TURNOVER CYSTO (KITS) ×2 IMPLANT
MANIFOLD NEPTUNE II (INSTRUMENTS) ×2 IMPLANT
PACK CYSTO (CUSTOM PROCEDURE TRAY) ×2 IMPLANT
PAD ARMBOARD 7.5X6 YLW CONV (MISCELLANEOUS) ×2 IMPLANT
SYSTEM UROLIFT (Male Continence) ×6 IMPLANT
TOWEL OR 17X26 4PK STRL BLUE (TOWEL DISPOSABLE) ×2 IMPLANT
TRAY FOLEY W/BAG SLVR 16FR (SET/KITS/TRAYS/PACK) ×2
TRAY FOLEY W/BAG SLVR 16FR ST (SET/KITS/TRAYS/PACK) ×1 IMPLANT
WATER STERILE IRR 3000ML UROMA (IV SOLUTION) ×2 IMPLANT
WATER STERILE IRR 500ML POUR (IV SOLUTION) ×2 IMPLANT

## 2021-12-27 NOTE — Transfer of Care (Signed)
Immediate Anesthesia Transfer of Care Note  Patient: James Vazquez  Procedure(s) Performed: CYSTOSCOPY WITH INSERTION OF UROLIFT (Prostate)  Patient Location: PACU  Anesthesia Type:General  Level of Consciousness: drowsy and patient cooperative  Airway & Oxygen Therapy: Patient Spontanous Breathing  Post-op Assessment: Report given to RN and Post -op Vital signs reviewed and stable  Post vital signs: Reviewed and stable  Last Vitals:  Vitals Value Taken Time  BP 124/79 12/27/21 0937  Temp    Pulse 74 12/27/21 0939  Resp 12 12/27/21 0939  SpO2 98 % 12/27/21 0939  Vitals shown include unvalidated device data.  Last Pain:  Vitals:   12/27/21 0745  TempSrc: Oral  PainSc: 0-No pain      Patients Stated Pain Goal: 5 (12/27/21 0745)  Complications: No notable events documented.

## 2021-12-27 NOTE — Anesthesia Procedure Notes (Signed)
Procedure Name: LMA Insertion Date/Time: 12/27/2021 8:58 AM  Performed by: Marny Lowenstein, CRNAPre-anesthesia Checklist: Patient identified, Emergency Drugs available, Suction available and Patient being monitored Patient Re-evaluated:Patient Re-evaluated prior to induction Oxygen Delivery Method: Circle system utilized Preoxygenation: Pre-oxygenation with 100% oxygen Induction Type: IV induction Ventilation: Mask ventilation without difficulty LMA: LMA inserted LMA Size: 5.0 Number of attempts: 2 Placement Confirmation: positive ETCO2 and breath sounds checked- equal and bilateral Tube secured with: Tape Dental Injury: Teeth and Oropharynx as per pre-operative assessment  Comments: Attempted LMA #4 but large leak despite readjusting

## 2021-12-27 NOTE — Op Note (Signed)
   PREOPERATIVE DIAGNOSIS:  Benign prostatic hypertrophy with bladder outlet obstruction.  POSTOPERATIVE DIAGNOSIS:  Benign prostatic hypertrophy with bladder outlet obstruction.  PROCEDURE:  Cystoscopy with implantation of UroLift devices, 6 implants.  SURGEON:  Wilkie Aye, M.D.  ANESTHESIA:  General  ANTIBIOTICS: ancef  SPECIMEN:  None.  DRAINS:  A 16-French Foley catheter.  BLOOD LOSS:  Minimal.  COMPLICATIONS:  None.  INDICATIONS: The Patient is an 81 year old male with BPH and bladder outlet obstruction.  He has failed medical therapy and has elected UroLift for definitive treatment.  FINDINGS OF PROCEDURE:  He was taken to the operating room where a genral anesthetic was induced.  He was placed in lithotomy position and was fitted with PAS hose.  His perineum and genitalia were prepped with chlorhexidine, and he was draped in usual sterile fashion.  Cystoscopy was performed using the UroLift scope and 0 degree lens. Examination revealed a normal urethra.  The external sphincter was intact.  Prostatic urethra was approximately 5cm in length with lateral lobe enlargement. There was also little bit of bladder neck elevation. Inspection of bladder revealed mild-to-moderate trabeculation with no tumors, stones, or inflammation.  No cellules or diverticula were noted. Ureteral orifices were in their normal anatomic position effluxing clear urine.  After initial cystoscopy, the visual obturator was replaced with the first UroLift device.  This was turned to the 9 o'clock position and pulled back to the veru and then slightly advanced.  Pressure was then applied to the right lateral lobe and the UroLift device was deployed.  The second UroLift device was then inserted and applied to the left lateral lobe at 3 o'clock and deployed in the mid prostatic urethra. After this, there was still some apparent obstruction closer to the bladder neck.  So a second and third  level of UroLift your left device was applied between the mid urethra and the proximal urethra providing further patency to the prostatic urethra.  The scope was removed and a 16-French Foley catheter was inserted without difficulty. The balloon was filled with 10 mL sterile fluid, and the catheter was placed to straight drainage.  COMPLICATIONS: None   CONDITION: Stable, extubated, transferred to PACU  PLAN: The patient will be discharged home and followup in 2 days for a voiding trial.

## 2021-12-27 NOTE — Anesthesia Preprocedure Evaluation (Signed)
Anesthesia Evaluation  Patient identified by MRN, date of birth, ID band Patient awake    Reviewed: Allergy & Precautions, H&P , NPO status , Patient's Chart, lab work & pertinent test results, reviewed documented beta blocker date and time   Airway Mallampati: II  TM Distance: >3 FB Neck ROM: full    Dental no notable dental hx.    Pulmonary neg pulmonary ROS,    Pulmonary exam normal breath sounds clear to auscultation       Cardiovascular Exercise Tolerance: Good hypertension, negative cardio ROS   Rhythm:regular Rate:Normal     Neuro/Psych negative neurological ROS  negative psych ROS   GI/Hepatic Neg liver ROS, GERD  Medicated,  Endo/Other  negative endocrine ROS  Renal/GU negative Renal ROS  negative genitourinary   Musculoskeletal   Abdominal   Peds  Hematology negative hematology ROS (+)   Anesthesia Other Findings   Reproductive/Obstetrics negative OB ROS                             Anesthesia Physical Anesthesia Plan  ASA: 2  Anesthesia Plan: General and General LMA   Post-op Pain Management:    Induction:   PONV Risk Score and Plan: Ondansetron  Airway Management Planned:   Additional Equipment:   Intra-op Plan:   Post-operative Plan:   Informed Consent: I have reviewed the patients History and Physical, chart, labs and discussed the procedure including the risks, benefits and alternatives for the proposed anesthesia with the patient or authorized representative who has indicated his/her understanding and acceptance.     Dental Advisory Given  Plan Discussed with: CRNA  Anesthesia Plan Comments:         Anesthesia Quick Evaluation

## 2021-12-27 NOTE — Interval H&P Note (Signed)
History and Physical Interval Note:  12/27/2021 8:35 AM  James Vazquez  has presented today for surgery, with the diagnosis of BPH with urinary retention.  The various methods of treatment have been discussed with the patient and family. After consideration of risks, benefits and other options for treatment, the patient has consented to  Procedure(s): CYSTOSCOPY WITH INSERTION OF UROLIFT (N/A) as a surgical intervention.  The patient's history has been reviewed, patient examined, no change in status, stable for surgery.  I have reviewed the patient's chart and labs.  Questions were answered to the patient's satisfaction.     Wilkie Aye

## 2021-12-28 ENCOUNTER — Emergency Department (HOSPITAL_COMMUNITY)
Admission: EM | Admit: 2021-12-28 | Discharge: 2021-12-28 | Disposition: A | Discharge status: Home / Self Care | Payer: Medicare Other | Attending: Emergency Medicine | Admitting: Emergency Medicine

## 2021-12-28 ENCOUNTER — Encounter (HOSPITAL_COMMUNITY): Payer: Self-pay | Admitting: Urology

## 2021-12-28 ENCOUNTER — Other Ambulatory Visit: Payer: Self-pay

## 2021-12-28 DIAGNOSIS — D72829 Elevated white blood cell count, unspecified: Secondary | ICD-10-CM | POA: Insufficient documentation

## 2021-12-28 DIAGNOSIS — R310 Gross hematuria: Secondary | ICD-10-CM | POA: Diagnosis not present

## 2021-12-28 DIAGNOSIS — R319 Hematuria, unspecified: Secondary | ICD-10-CM | POA: Diagnosis present

## 2021-12-28 LAB — CBC WITH DIFFERENTIAL/PLATELET
Abs Immature Granulocytes: 0.03 10*3/uL (ref 0.00–0.07)
Basophils Absolute: 0 10*3/uL (ref 0.0–0.1)
Basophils Relative: 0 %
Eosinophils Absolute: 0 10*3/uL (ref 0.0–0.5)
Eosinophils Relative: 0 %
HCT: 35.5 % — ABNORMAL LOW (ref 39.0–52.0)
Hemoglobin: 11.6 g/dL — ABNORMAL LOW (ref 13.0–17.0)
Immature Granulocytes: 0 %
Lymphocytes Relative: 19 %
Lymphs Abs: 2.6 10*3/uL (ref 0.7–4.0)
MCH: 30.6 pg (ref 26.0–34.0)
MCHC: 32.7 g/dL (ref 30.0–36.0)
MCV: 93.7 fL (ref 80.0–100.0)
Monocytes Absolute: 1.1 10*3/uL — ABNORMAL HIGH (ref 0.1–1.0)
Monocytes Relative: 8 %
Neutro Abs: 9.8 10*3/uL — ABNORMAL HIGH (ref 1.7–7.7)
Neutrophils Relative %: 73 %
Platelets: 255 10*3/uL (ref 150–400)
RBC: 3.79 MIL/uL — ABNORMAL LOW (ref 4.22–5.81)
RDW: 12.6 % (ref 11.5–15.5)
WBC: 13.6 10*3/uL — ABNORMAL HIGH (ref 4.0–10.5)
nRBC: 0 % (ref 0.0–0.2)

## 2021-12-28 LAB — URINALYSIS, ROUTINE W REFLEX MICROSCOPIC
Bacteria, UA: NONE SEEN
Bilirubin Urine: NEGATIVE
Glucose, UA: 50 mg/dL — AB
Ketones, ur: NEGATIVE mg/dL
Nitrite: NEGATIVE
Protein, ur: 100 mg/dL — AB
RBC / HPF: >50 RBC/hpf — ABNORMAL HIGH (ref 0–5)
Specific Gravity, Urine: 1.004 — ABNORMAL LOW (ref 1.005–1.030)
WBC, UA: >50 WBC/hpf — ABNORMAL HIGH (ref 0–5)
pH: 8 (ref 5.0–8.0)

## 2021-12-28 LAB — BASIC METABOLIC PANEL
Anion gap: 8 (ref 5–15)
BUN: 11 mg/dL (ref 8–23)
CO2: 28 mmol/L (ref 22–32)
Calcium: 9.3 mg/dL (ref 8.9–10.3)
Chloride: 101 mmol/L (ref 98–111)
Creatinine, Ser: 1.06 mg/dL (ref 0.61–1.24)
GFR, Estimated: >60 mL/min (ref >60)
Glucose, Bld: 155 mg/dL — ABNORMAL HIGH (ref 70–99)
Potassium: 4 mmol/L (ref 3.5–5.1)
Sodium: 137 mmol/L (ref 135–145)

## 2021-12-28 NOTE — ED Triage Notes (Signed)
Pt had a urolift performed by Dr. Ronne Binning on yesterday, foley placed, supposed to have a small amount of blood in bag, currently urinating straight blood in leg bag.

## 2021-12-28 NOTE — Discharge Instructions (Signed)
Call your urologist office tomorrow and let them know how you are doing.  I spoke with on-call urologist tonight who is okay with you going home, provided your labs looked good and the Foley is draining well.  No concerns regarding her blood cell counts tonight.

## 2021-12-28 NOTE — ED Provider Notes (Signed)
[facility] : [facility] EMERGENCY DEPARTMENT Provider Note   CSN: [id number] Arrival date & time: 12/28/21  1631     History  Chief Complaint  Patient presents with   Hematuria    James Vazquez is a 81 y.o. male.   Hematuria Pertinent negatives include no chest pain, no abdominal pain and no shortness of breath.       James Vazquez is a 81 y.o. male who presents to the Emergency Department requesting evaluation of hematuria.  Patient underwent cystoscopy with UroLift yesterday by Dr. Ronne Binning.  He reported having some mild blood in his urine upon discharged.  Today, noticed increasing blood in his urine.  States that his urine is "blood red."  He denies any generalized weakness dizziness or syncope.  No abdominal pain and he states the catheter is flowing well.  No pain at his penis or burning with urination.  Patient does not take any NSAIDs or blood thinners.    Home Medications Prior to Admission medications   Medication Sig Start Date End Date Taking? Authorizing Provider  Multiple Vitamins-Minerals (MULTIVITAMIN WITH MINERALS) tablet Take 1 tablet by mouth daily.    [provider]  pantoprazole (PROTONIX) 40 MG tablet Take 1 tablet (40 mg total) by mouth daily before breakfast. 11/20/21   Dimple Nanas, MD  silodosin (RAPAFLO) 8 MG CAPS capsule Take 1 capsule (8 mg total) by mouth daily with breakfast. 11/25/21   Summerlin, Regan Rakers, PA-C      Allergies    Patient has no known allergies.    Review of Systems   Review of Systems  Constitutional:  Negative for chills and fever.  Respiratory:  Negative for shortness of breath.   Cardiovascular:  Negative for chest pain.  Gastrointestinal:  Negative for abdominal pain, nausea and vomiting.  Genitourinary:  Positive for hematuria. Negative for decreased urine volume, difficulty urinating, dysuria, penile pain, penile swelling, scrotal swelling and testicular pain.  Neurological:  Negative for dizziness,  syncope and weakness.    Physical Exam Updated Vital Signs BP 133/73 (BP Location: Right Arm)   Pulse 96   Temp 97.9 F (36.6 C) (Oral)   Resp 16   Ht 6' (1.829 m)   Wt 68.9 kg   SpO2 97%   BMI 20.61 kg/m  Physical Exam Vitals and nursing note reviewed.  Constitutional:      General: He is not in acute distress.    Appearance: Normal appearance. He is not toxic-appearing.  Cardiovascular:     Rate and Rhythm: Normal rate and regular rhythm.     Pulses: Normal pulses.  Pulmonary:     Effort: Pulmonary effort is normal. No respiratory distress.  Chest:     Chest wall: No tenderness.  Abdominal:     General: There is no distension.     Palpations: Abdomen is soft.     Tenderness: There is no abdominal tenderness.  Genitourinary:    Comments: Patient has Foley catheter placed with leg bag attached.  Gross hematuria, no significant clots in the catheter bag.  Urine appears to be draining well. Musculoskeletal:     Right lower leg: No edema.     Left lower leg: No edema.  Skin:    General: Skin is warm.     Capillary Refill: Capillary refill takes less than 2 seconds.     Findings: No rash.  Neurological:     General: No focal deficit present.     Mental Status: He is alert.  Sensory: No sensory deficit.     Motor: No weakness.    ED Results / Procedures / Treatments   Labs (all labs ordered are listed, but only abnormal results are displayed) Labs Reviewed  CBC WITH DIFFERENTIAL/PLATELET - Abnormal; Notable for the following components:      Result Value   WBC 13.6 (*)    RBC 3.79 (*)    Hemoglobin 11.6 (*)    HCT 35.5 (*)    Neutro Abs 9.8 (*)    Monocytes Absolute 1.1 (*)    All other components within normal limits  BASIC METABOLIC PANEL - Abnormal; Notable for the following components:   Glucose, Bld 155 (*)    All other components within normal limits  URINALYSIS, ROUTINE W REFLEX MICROSCOPIC    EKG None  Radiology No results  found.  Procedures Procedures     Foley catheter was irrigated by nursing staff without difficulty.  Irrigated with approximately 100cc of saline with similar return.  No significant clots.  Catheter flushed without difficulty or resistance.  Continues to have some hematuria but improved   Medications Ordered in ED Medications - No data to display  ED Course/ Medical Decision Making/ A&P                           Medical Decision Making Patient here for evaluation of gross hematuria after having UroLift procedure performed yesterday.  States he has had gradually worsening blood in his urine since discharge home.  No abdominal pain, fever chills, nausea or vomiting.  No pain of his genitals or burning with urination.  On exam, patient well-appearing.  Vital signs are reassuring.  There is frank blood in his Foley catheter bag.  I do not appreciate any blood clots.  The excessive hematuria may be from a blood clot, will try irrigating the Foley catheter.  We will also obtain labs and urinalysis.    Amount and/or Complexity of Data Reviewed Labs: ordered.    Details: Labs show mild leukocytosis with white count of 13,600 hemoglobin today is 11.6 it was 11.8 six days ago.  Chemistries unremarkable urinalysis is still pending Discussion of management or test interpretation with external provider(s): Urinalysis still pending at end of shift.  Discussed care plan with Rhea Bleacher, PA-C who agrees to review urinalysis results.  He will consult urology for further recommendations.  I suspect patient will be discharged home with close urology follow-up.           Final Clinical Impression(s) / ED Diagnoses Final diagnoses:  Gross hematuria    Rx / DC Orders ED Discharge Orders     None         Pauline Aus, PA-C 12/28/21 1944    Bethann Berkshire, MD 12/29/21 1053

## 2021-12-28 NOTE — ED Provider Notes (Signed)
Signout from [employer] at shift change. Briefly, patient presents for hematuria after UroLift procedure performed yesterday.   Plan: Follow-up on UA, discussed with urology.   9:06 PM Reassessment performed. Patient appears comfortable.  He is requesting assistance straining his Foley bag which is nearly full with blood-tinged urine.  Foley is draining well.  Labs and imaging personally reviewed and interpreted including: CBC with elevated white blood cell count at 13.6, hemoglobin 11.6, at baseline; BMP with glucose 155 otherwise unremarkable; UA with greater than 50 RBC and white blood cells per high-power field.    Reviewed additional pertinent lab work and imaging with patient at bedside including: Stable blood cell counts   Most current vital signs reviewed and are as follows: BP (!) 148/107   Pulse 100   Temp 97.9 F (36.6 C) (Oral)   Resp 20   Ht 6' (1.829 m)   Wt 68.9 kg   SpO2 100%   BMI 20.61 kg/m     Plan: Discharge to home    Home treatment: continue plan as discussed with urologist   Return and follow-up instructions: Encouraged return to ED with lightheadedness, shortness of breath, worsening bleeding. Encouraged patient to follow-up with their urologist tomorrow as planned.      Renne Crigler, PA-C 12/28/21 2108    Bethann Berkshire, MD 12/29/21 1053

## 2021-12-29 ENCOUNTER — Ambulatory Visit: Payer: Medicare Other | Admitting: Urology

## 2021-12-29 ENCOUNTER — Encounter: Payer: Self-pay | Admitting: Urology

## 2021-12-29 VITALS — BP 134/78 | HR 106

## 2021-12-29 DIAGNOSIS — R339 Retention of urine, unspecified: Secondary | ICD-10-CM

## 2021-12-29 DIAGNOSIS — N3001 Acute cystitis with hematuria: Secondary | ICD-10-CM | POA: Diagnosis not present

## 2021-12-29 MED ORDER — SILODOSIN 8 MG PO CAPS
8.0000 mg | ORAL_CAPSULE | Freq: Every day | ORAL | 0 refills | Status: DC
Start: 2021-12-29 — End: 2022-01-26

## 2021-12-29 MED ORDER — NITROFURANTOIN MONOHYD MACRO 100 MG PO CAPS: 100.0000 mg | ORAL_CAPSULE | Freq: Two times a day (BID) | ORAL | 0 refills | Status: DC

## 2021-12-29 NOTE — Progress Notes (Signed)
12/29/2021 8:53 AM   James Vazquez 06/06/1940 [id number]  Referring provider: Alvina Filbert, MD [address] [place],  [place] [address]  Followup urinary retention  HPI: James Vazquez is a 81yo here for followup after Urolift. He developed gross hematuria last night and presented to the ER. Yesterday her developed dark bloody urine. He has mild pelvic pain. He has mild urgency. Urine is currently dark red.    PMH: Past Medical History:  Diagnosis Date   Arthritis    GERD (gastroesophageal reflux disease)    Hypertension     Surgical History: Past Surgical History:  Procedure Laterality Date   CYSTOSCOPY WITH INSERTION OF UROLIFT N/A 12/27/2021   Procedure: CYSTOSCOPY WITH INSERTION OF UROLIFT;  Surgeon: Malen Gauze, MD;  Location: [facility];  Service: Urology;  Laterality: N/A;   HERNIA REPAIR     TONSILLECTOMY      Home Medications:  Allergies as of 12/29/2021   No Known Allergies      Medication List        Accurate as of December 29, 2021  8:53 AM. If you have any questions, ask your nurse or doctor.          ascorbic acid 500 MG tablet Commonly known as: VITAMIN C Take 500 mg by mouth daily.   multivitamin with minerals tablet Take 1 tablet by mouth daily.   pantoprazole 40 MG tablet Commonly known as: PROTONIX Take 1 tablet (40 mg total) by mouth daily before breakfast.   silodosin 8 MG Caps capsule Commonly known as: RAPAFLO Take 1 capsule (8 mg total) by mouth daily with breakfast.        Allergies: No Known Allergies  Family History: No family history on file.  Social History:  reports that he has never smoked. He has never used smokeless tobacco. He reports that he does not drink alcohol and does not use drugs.  ROS: All other review of systems were reviewed and are negative except what is noted above in HPI  Physical Exam: BP 134/78   Pulse (!) 106   Constitutional:  Alert and oriented, No acute distress. HEENT: [place] AT,  moist mucus membranes.  Trachea midline, no masses. Cardiovascular: No clubbing, cyanosis, or edema. Respiratory: Normal respiratory effort, no increased work of breathing. GI: Abdomen is soft, nontender, nondistended, no abdominal masses GU: No CVA tenderness.  Lymph: No cervical or inguinal lymphadenopathy. Skin: No rashes, bruises or suspicious lesions. Neurologic: Grossly intact, no focal deficits, moving all 4 extremities. Psychiatric: Normal mood and affect.  Laboratory Data: Lab Results  Component Value Date   WBC 13.6 (H) 12/28/2021   HGB 11.6 (L) 12/28/2021   HCT 35.5 (L) 12/28/2021   MCV 93.7 12/28/2021   PLT 255 12/28/2021    Lab Results  Component Value Date   CREATININE 1.06 12/28/2021    No results found for: "PSA"  No results found for: "TESTOSTERONE"  No results found for: "HGBA1C"  Urinalysis    Component Value Date/Time   COLORURINE RED (A) 12/28/2021 1944   APPEARANCEUR CLEAR 12/28/2021 1944   LABSPEC 1.004 (L) 12/28/2021 1944   PHURINE 8.0 12/28/2021 1944   GLUCOSEU 50 (A) 12/28/2021 1944   HGBUR MODERATE (A) 12/28/2021 1944   BILIRUBINUR NEGATIVE 12/28/2021 1944   KETONESUR NEGATIVE 12/28/2021 1944   PROTEINUR 100 (A) 12/28/2021 1944   NITRITE NEGATIVE 12/28/2021 1944   LEUKOCYTESUR SMALL (A) 12/28/2021 1944    Lab Results  Component Value Date  BACTERIA NONE SEEN 12/28/2021    Pertinent Imaging:  No results found for this or any previous visit.  No results found for this or any previous visit.  No results found for this or any previous visit.  No results found for this or any previous visit.  No results found for this or any previous visit.  No results found for this or any previous visit.  No results found for this or any previous visit.  No results found for this or any previous visit.   Assessment & Plan:    1. Urinary retention -RTC 2 days for voiding trial  2. Acute cystitis -Urine for culture -macrobid 100mg   BID for 7 days   No follow-ups on file.  , MD  [facility] [place]

## 2021-12-29 NOTE — Patient Instructions (Signed)

## 2021-12-29 NOTE — Anesthesia Postprocedure Evaluation (Signed)
Anesthesia Post Note  Patient: James Vazquez  Procedure(s) Performed: CYSTOSCOPY WITH INSERTION OF UROLIFT (Prostate)  Patient location during evaluation: Phase II Anesthesia Type: General Level of consciousness: awake Pain management: pain level controlled Vital Signs Assessment: post-procedure vital signs reviewed and stable Respiratory status: spontaneous breathing and respiratory function stable Cardiovascular status: blood pressure returned to baseline and stable Postop Assessment: no headache and no apparent nausea or vomiting Anesthetic complications: no Comments: Late entry   No notable events documented.   Last Vitals:  Vitals:   12/27/21 0955 12/27/21 1000  BP: 136/78 (!) 141/79  Pulse: 72 69  Resp: 16 17  Temp:    SpO2: 96% 97%    Last Pain:  Vitals:   12/27/21 1003  TempSrc:   PainSc: 0-No pain                 Windell Norfolk

## 2021-12-31 ENCOUNTER — Ambulatory Visit (INDEPENDENT_AMBULATORY_CARE_PROVIDER_SITE_OTHER): Payer: Medicare Other | Admitting: Urology

## 2021-12-31 VITALS — BP 120/80 | HR 106

## 2021-12-31 DIAGNOSIS — R339 Retention of urine, unspecified: Secondary | ICD-10-CM

## 2021-12-31 LAB — BLADDER SCAN AMB NON-IMAGING: Scan Result: 865

## 2021-12-31 NOTE — Progress Notes (Signed)
Fill and Pull Catheter Removal  Patient is present today for a catheter removal.  Patient was cleaned and prepped in a sterile fashion 310 ml of sterile water/ saline was instilled into the bladder when the patient felt the urge to urinate. 10 ml of water was then drained from the balloon.  A 16FR foley cath was removed from the bladder no complications were noted .  Patient as then given some time to void on their own.  Patient cannot void  0 ml on their own after some time.  Patient tolerated well.  Performed by: Kennyth Lose, CMA  Follow up/ Additional notes: Follow up as schedule   Simple Catheter Placement  Due to urinary retention patient is present today for a foley cath placement.  Patient was cleaned and prepped in a sterile fashion with betadine. A 16 FR foley catheter was inserted, urine return was noted  900  ml, urine was Yellow in color.  The balloon was filled with 10cc of sterile water.  A leg bag was attached for drainage. Patient was also given a night bag to take home and was given instruction on how to change from one bag to another.  Patient was given instruction on proper catheter care.  Patient had discomfort with insertion of the cathter no complications were noted   Performed by: Guss Bunde CMA, Assisted [employer]ack, CMA  Additional notes/ Follow up: Followed up as schedule

## 2021-12-31 NOTE — Progress Notes (Deleted)
scan

## 2022-01-03 ENCOUNTER — Other Ambulatory Visit: Payer: Self-pay

## 2022-01-03 DIAGNOSIS — N3001 Acute cystitis with hematuria: Secondary | ICD-10-CM

## 2022-01-03 LAB — URINE CULTURE

## 2022-01-03 MED ORDER — DOXYCYCLINE HYCLATE 100 MG PO CAPS: 100.0000 mg | ORAL_CAPSULE | Freq: Two times a day (BID) | ORAL | 0 refills | Status: DC

## 2022-01-03 NOTE — Progress Notes (Signed)
Patient called and made aware to stop Macrobid and start doxycyline.

## 2022-01-06 ENCOUNTER — Ambulatory Visit (INDEPENDENT_AMBULATORY_CARE_PROVIDER_SITE_OTHER): Payer: Medicare Other | Admitting: Urology

## 2022-01-06 DIAGNOSIS — R339 Retention of urine, unspecified: Secondary | ICD-10-CM | POA: Diagnosis not present

## 2022-01-06 LAB — BLADDER SCAN AMB NON-IMAGING: Scan Result: >1095

## 2022-01-06 NOTE — Progress Notes (Signed)
Fill and Pull Catheter Removal  Patient is present today for a catheter removal.  Patient was cleaned and prepped in a sterile fashion of sterile water/ saline was instilled into the bladder when the patient felt the urge to urinate. 37ml of water was then drained from the balloon.  A 16FR foley cath was removed from the bladder no complications were noted .  Patient as then given some time to void on their own.  Patient cannot void on their own after some time.  Patient tolerated well.  Patient will come back this afternoon or sooner if needed for PVR  Performed by: Bay Wayson LPN

## 2022-01-06 NOTE — Progress Notes (Signed)
Pt here today for bladder scan. Bladder was scanned and >1095 was visualized.  Simple Catheter Placement  Due to urinary retention patient is present today for a foley cath placement.  Patient was cleaned and prepped in a sterile fashion with betadine. A 16 FR  coude foley catheter was inserted, urine return was noted  1100 ml, urine was yellow in color.  The balloon was filled with 10cc of sterile water.  A leg bag was attached for drainage. Patient was also given a night bag to take home and was given instruction on how to change from one bag to another.  Patient was given instruction on proper catheter care.  Patient tolerated well, no complications were noted   Performed by: Kennyth Lose, CMA  Additional notes/ Follow up: Patient to followed up next week with Dr. Arlyss Repress

## 2022-01-07 ENCOUNTER — Encounter: Payer: Self-pay | Admitting: Urology

## 2022-01-07 NOTE — Progress Notes (Signed)
Patient ID: James Vazquez, male   DOB: 30-Jun-1940, 81 y.o.   MRN: [id number] Nursing notes reviewed.

## 2022-01-12 ENCOUNTER — Ambulatory Visit: Payer: Medicare Other | Admitting: Urology

## 2022-01-12 ENCOUNTER — Encounter: Payer: Self-pay | Admitting: Urology

## 2022-01-12 VITALS — BP 144/82 | HR 97

## 2022-01-12 DIAGNOSIS — R339 Retention of urine, unspecified: Secondary | ICD-10-CM

## 2022-01-12 DIAGNOSIS — N401 Enlarged prostate with lower urinary tract symptoms: Secondary | ICD-10-CM

## 2022-01-12 DIAGNOSIS — N138 Other obstructive and reflux uropathy: Secondary | ICD-10-CM | POA: Diagnosis not present

## 2022-01-12 NOTE — Progress Notes (Signed)
01/12/2022 8:52 AM   James Vazquez July 06, 1940 [id number]  Referring provider: Alvina Filbert, MD [address] [place],  [place] [address]  Followup urinary retention   HPI: James Vazquez is 81yo here for followup for BPH with urinary retention. He has failed 3 voiding trials after urolift.    PMH: Past Medical History:  Diagnosis Date   Arthritis    GERD (gastroesophageal reflux disease)    Hypertension     Surgical History: Past Surgical History:  Procedure Laterality Date   CYSTOSCOPY WITH INSERTION OF UROLIFT N/A 12/27/2021   Procedure: CYSTOSCOPY WITH INSERTION OF UROLIFT;  Surgeon: James Gauze, MD;  Location: [facility];  Service: Urology;  Laterality: N/A;   HERNIA REPAIR     TONSILLECTOMY      Home Medications:  Allergies as of 01/12/2022   No Known Allergies      Medication List        Accurate as of January 12, 2022  8:52 AM. If you have any questions, ask your nurse or doctor.          ascorbic acid 500 MG tablet Commonly known as: VITAMIN C Take 500 mg by mouth daily.   doxycycline 100 MG capsule Commonly known as: VIBRAMYCIN Take 1 capsule (100 mg total) by mouth every 12 (twelve) hours.   multivitamin with minerals tablet Take 1 tablet by mouth daily.   pantoprazole 40 MG tablet Commonly known as: PROTONIX Take 1 tablet (40 mg total) by mouth daily before breakfast.   silodosin 8 MG Caps capsule Commonly known as: RAPAFLO Take 1 capsule (8 mg total) by mouth daily with breakfast.        Allergies: No Known Allergies  Family History: No family history on file.  Social History:  reports that he has never smoked. He has never used smokeless tobacco. He reports that he does not drink alcohol and does not use drugs.  ROS: All other review of systems were reviewed and are negative except what is noted above in HPI  Physical Exam: BP (!) 144/82   Pulse 97   Constitutional:  Alert and oriented, No acute distress. HEENT: [place]  AT, moist mucus membranes.  Trachea midline, no masses. Cardiovascular: No clubbing, cyanosis, or edema. Respiratory: Normal respiratory effort, no increased work of breathing. GI: Abdomen is soft, nontender, nondistended, no abdominal masses GU: No CVA tenderness.  Lymph: No cervical or inguinal lymphadenopathy. Skin: No rashes, bruises or suspicious lesions. Neurologic: Grossly intact, no focal deficits, moving all 4 extremities. Psychiatric: Normal mood and affect.  Laboratory Data: Lab Results  Component Value Date   WBC 13.6 (H) 12/28/2021   HGB 11.6 (L) 12/28/2021   HCT 35.5 (L) 12/28/2021   MCV 93.7 12/28/2021   PLT 255 12/28/2021    Lab Results  Component Value Date   CREATININE 1.06 12/28/2021    No results found for: "PSA"  No results found for: "TESTOSTERONE"  No results found for: "HGBA1C"  Urinalysis    Component Value Date/Time   COLORURINE RED (A) 12/28/2021 1944   APPEARANCEUR CLEAR 12/28/2021 1944   LABSPEC 1.004 (L) 12/28/2021 1944   PHURINE 8.0 12/28/2021 1944   GLUCOSEU 50 (A) 12/28/2021 1944   HGBUR MODERATE (A) 12/28/2021 1944   BILIRUBINUR NEGATIVE 12/28/2021 1944   KETONESUR NEGATIVE 12/28/2021 1944   PROTEINUR 100 (A) 12/28/2021 1944   NITRITE NEGATIVE 12/28/2021 1944   LEUKOCYTESUR SMALL (A) 12/28/2021 1944    Lab Results  Component Value Date  BACTERIA NONE SEEN 12/28/2021    Pertinent Imaging:  No results found for this or any previous visit.  No results found for this or any previous visit.  No results found for this or any previous visit.  No results found for this or any previous visit.  No results found for this or any previous visit.  No results found for this or any previous visit.  No results found for this or any previous visit.  No results found for this or any previous visit.   Assessment & Plan:    1. Urinary retention -Schedule for UDS  2. Benign prostatic hyperplasia with urinary  obstruction -continue indwelling foley   No follow-ups on file.  Wilkie Aye, MD  [facility] [place]

## 2022-01-12 NOTE — Progress Notes (Signed)
Fill and Pull Catheter Removal  Patient is present today for a catheter removal.  Patient was cleaned and prepped in a sterile fashion of sterile water/ saline was instilled into the bladder when the patient felt the urge to urinate. 66ml of water was then drained from the balloon.  A 16FR foley cath was removed from the bladder no complications were noted .  Patient as then given some time to void on their own.  Patient cannot void  65ml on their own after some time.  Patient tolerated well.  Performed by: Guss Bunde, CMA  Follow up/ Additional notes: Follow up as scheduled.    Simple Catheter Placement  Due to urinary retention patient is present today for a foley cath placement.  Patient was cleaned and prepped in a sterile fashion with betadine. A 16 FR foley catheter was inserted, urine return was noted  , urine was yellow in color.  The balloon was filled with 10cc of sterile water.  A lrg bag was attached for drainage. Patient was also given a night bag to take home and was given instruction on how to change from one bag to another.  Patient was given instruction on proper catheter care.  Patient tolerated well, no complications were noted   Performed by: Guss Bunde ,CMA  Additional notes/ Follow up: Follow up as scheduled.

## 2022-01-12 NOTE — Patient Instructions (Signed)
Acute Urinary Retention, Male  Acute urinary retention is when a person cannot pee (urinate) at all, or can only pee a little. This can come on all of a sudden. If it is not treated, it can lead to kidney problems or other serious problems. What are the causes? A problem with the tube that drains the bladder (urethra). Problems with the nerves in the bladder. Tumors. Certain medicines. An infection. Having trouble pooping (constipation). What increases the risk? Older men are more at risk because their prostate gland may become larger as they age. Other conditions also can increase risk. These include: Diseases, such as multiple sclerosis. Injury to the spinal cord. Diabetes. A condition that affects the way the brain works, such as dementia. Holding back urine due to trauma or because you do not want to use the bathroom. What are the signs or symptoms? Trouble peeing. Pain in the lower belly. How is this treated? Treatment for this condition may include: Medicines. Placing a thin, germ-free tube (catheter) into the bladder to drain pee out of the body. Therapy to treat mental health conditions. Treatment for conditions that may cause this. If needed, you may be treated in the hospital for kidney problems or to manage other problems. Follow these instructions at home: Medicines Take over-the-counter and prescription medicines only as told by your doctor. Ask your doctor what medicines you should stay away from. If you were given an antibiotic medicine, take it as told by your doctor. Do not stop taking it, even if you start to feel better. General instructions Do not smoke or use any products that contain nicotine or tobacco. If you need help quitting, ask your doctor. Drink enough fluid to keep your pee pale yellow. If you were sent home with a tube that drains the bladder, take care of it as told by your doctor. Watch for changes in your symptoms. Tell your doctor about them. If  told, keep track of changes in your blood pressure at home. Tell your doctor about them. Keep all follow-up visits. Contact a doctor if: You have spasms in your bladder that you cannot stop. You leak pee when you have spasms. Get help right away if: You have chills or a fever. You have blood in your pee. You have a tube that drains pee from the bladder and these things happen: The tube stops draining pee. The tube falls out. Summary Acute urinary retention is when you cannot pee at all or you pee too little. If this condition is not treated, it can lead to kidney problems or other serious problems. If you were sent home with a tube (catheter) that drains the bladder, take care of it as told by your doctor. Watch for changes in your symptoms. Tell your doctor about them. This information is not intended to replace advice given to you by your health care provider. Make sure you discuss any questions you have with your health care provider. Document Revised: 02/05/2020 Document Reviewed: 02/05/2020 Elsevier Patient Education  2023 [employer]  

## 2022-01-26 ENCOUNTER — Other Ambulatory Visit: Payer: Self-pay

## 2022-01-26 MED ORDER — SILODOSIN 8 MG PO CAPS: 8.0000 mg | ORAL_CAPSULE | Freq: Every day | ORAL | 2 refills | Status: DC

## 2022-02-11 ENCOUNTER — Encounter: Payer: Self-pay | Admitting: Urology

## 2022-02-11 ENCOUNTER — Ambulatory Visit: Payer: Medicare Other | Admitting: Urology

## 2022-02-11 VITALS — BP 138/77 | HR 75

## 2022-02-11 DIAGNOSIS — R339 Retention of urine, unspecified: Secondary | ICD-10-CM

## 2022-02-11 NOTE — Progress Notes (Signed)
02/11/2022 10:16 AM   James Vazquez 06-Jun-1940 [id number]  Referring provider: Alvina Filbert, MD [address] [place],  [place] [address]  Followup urinary retention   HPI: Mr James Vazquez is a 81yo here for followup for BPH with urinary retention. He underwent UDS which showed a 700cc capacity, max detrusor contraction 56cm of water.    PMH: Past Medical History:  Diagnosis Date   Arthritis    GERD (gastroesophageal reflux disease)    Hypertension     Surgical History: Past Surgical History:  Procedure Laterality Date   CYSTOSCOPY WITH INSERTION OF UROLIFT N/A 12/27/2021   Procedure: CYSTOSCOPY WITH INSERTION OF UROLIFT;  Surgeon: Malen Gauze, MD;  Location: [facility];  Service: Urology;  Laterality: N/A;   HERNIA REPAIR     TONSILLECTOMY      Home Medications:  Allergies as of 02/11/2022   No Known Allergies      Medication List        Accurate as of February 11, 2022 10:16 AM. If you have any questions, ask your nurse or doctor.          ascorbic acid 500 MG tablet Commonly known as: VITAMIN C Take 500 mg by mouth daily.   doxycycline 100 MG capsule Commonly known as: VIBRAMYCIN Take 1 capsule (100 mg total) by mouth every 12 (twelve) hours.   multivitamin with minerals tablet Take 1 tablet by mouth daily.   pantoprazole 40 MG tablet Commonly known as: PROTONIX Take 1 tablet (40 mg total) by mouth daily before breakfast.   silodosin 8 MG Caps capsule Commonly known as: RAPAFLO Take 1 capsule (8 mg total) by mouth daily with breakfast.        Allergies: No Known Allergies  Family History: No family history on file.  Social History:  reports that he has never smoked. He has never used smokeless tobacco. He reports that he does not drink alcohol and does not use drugs.  ROS: All other review of systems were reviewed and are negative except what is noted above in HPI  Physical Exam: BP 138/77   Pulse 75   Constitutional:  Alert  and oriented, No acute distress. HEENT: [place] AT, moist mucus membranes.  Trachea midline, no masses. Cardiovascular: No clubbing, cyanosis, or edema. Respiratory: Normal respiratory effort, no increased work of breathing. GI: Abdomen is soft, nontender, nondistended, no abdominal masses GU: No CVA tenderness.  Lymph: No cervical or inguinal lymphadenopathy. Skin: No rashes, bruises or suspicious lesions. Neurologic: Grossly intact, no focal deficits, moving all 4 extremities. Psychiatric: Normal mood and affect.  Laboratory Data: Lab Results  Component Value Date   WBC 13.6 (H) 12/28/2021   HGB 11.6 (L) 12/28/2021   HCT 35.5 (L) 12/28/2021   MCV 93.7 12/28/2021   PLT 255 12/28/2021    Lab Results  Component Value Date   CREATININE 1.06 12/28/2021    No results found for: "PSA"  No results found for: "TESTOSTERONE"  No results found for: "HGBA1C"  Urinalysis    Component Value Date/Time   COLORURINE RED (A) 12/28/2021 1944   APPEARANCEUR CLEAR 12/28/2021 1944   LABSPEC 1.004 (L) 12/28/2021 1944   PHURINE 8.0 12/28/2021 1944   GLUCOSEU 50 (A) 12/28/2021 1944   HGBUR MODERATE (A) 12/28/2021 1944   BILIRUBINUR NEGATIVE 12/28/2021 1944   KETONESUR NEGATIVE 12/28/2021 1944   PROTEINUR 100 (A) 12/28/2021 1944   NITRITE NEGATIVE 12/28/2021 1944   LEUKOCYTESUR SMALL (A) 12/28/2021 1944    Lab  Results  Component Value Date   BACTERIA NONE SEEN 12/28/2021    Pertinent Imaging:  No results found for this or any previous visit.  No results found for this or any previous visit.  No results found for this or any previous visit.  No results found for this or any previous visit.  No results found for this or any previous visit.  No results found for this or any previous visit.  No results found for this or any previous visit.  No results found for this or any previous visit.   Assessment & Plan:    1. Urinary retention We discussed the management of his BPH  including continued medical therapy, Rezum, Urolift, TURP and simple prostatectomy. After discussing the options the patient has elected to proceed with Urolift. Risks/benefits/alternatives discussed.    No follow-ups on file.  Wilkie Aye, MD  [facility] [place]

## 2022-02-11 NOTE — H&P (View-Only) (Signed)
 02/11/2022 10:16 AM   James Vazquez 04/17/1941 [id number]  Referring provider: Hunter, Denise, MD [address] [place],  [place] [address]  Followup urinary retention   HPI: James Vazquez is a 81yo here for followup for BPH with urinary retention. He underwent UDS which showed a 700cc capacity, max detrusor contraction 56cm of water.    PMH: Past Medical History:  Diagnosis Date   Arthritis    GERD (gastroesophageal reflux disease)    Hypertension     Surgical History: Past Surgical History:  Procedure Laterality Date   CYSTOSCOPY WITH INSERTION OF UROLIFT N/A 12/27/2021   Procedure: CYSTOSCOPY WITH INSERTION OF UROLIFT;  Surgeon: Cassiel Fernandez L, MD;  Location: [facility];  Service: Urology;  Laterality: N/A;   HERNIA REPAIR     TONSILLECTOMY      Home Medications:  Allergies as of 02/11/2022   No Known Allergies      Medication List        Accurate as of February 11, 2022 10:16 AM. If you have any questions, ask your nurse or doctor.          ascorbic acid 500 MG tablet Commonly known as: VITAMIN C Take 500 mg by mouth daily.   doxycycline 100 MG capsule Commonly known as: VIBRAMYCIN Take 1 capsule (100 mg total) by mouth every 12 (twelve) hours.   multivitamin with minerals tablet Take 1 tablet by mouth daily.   pantoprazole 40 MG tablet Commonly known as: PROTONIX Take 1 tablet (40 mg total) by mouth daily before breakfast.   silodosin 8 MG Caps capsule Commonly known as: RAPAFLO Take 1 capsule (8 mg total) by mouth daily with breakfast.        Allergies: No Known Allergies  Family History: No family history on file.  Social History:  reports that he has never smoked. He has never used smokeless tobacco. He reports that he does not drink alcohol and does not use drugs.  ROS: All other review of systems were reviewed and are negative except what is noted above in HPI  Physical Exam: BP 138/77   Pulse 75   Constitutional:  Alert  and oriented, No acute distress. HEENT: [place] AT, moist mucus membranes.  Trachea midline, no masses. Cardiovascular: No clubbing, cyanosis, or edema. Respiratory: Normal respiratory effort, no increased work of breathing. GI: Abdomen is soft, nontender, nondistended, no abdominal masses GU: No CVA tenderness.  Lymph: No cervical or inguinal lymphadenopathy. Skin: No rashes, bruises or suspicious lesions. Neurologic: Grossly intact, no focal deficits, moving all 4 extremities. Psychiatric: Normal mood and affect.  Laboratory Data: Lab Results  Component Value Date   WBC 13.6 (H) 12/28/2021   HGB 11.6 (L) 12/28/2021   HCT 35.5 (L) 12/28/2021   MCV 93.7 12/28/2021   PLT 255 12/28/2021    Lab Results  Component Value Date   CREATININE 1.06 12/28/2021    No results found for: "PSA"  No results found for: "TESTOSTERONE"  No results found for: "HGBA1C"  Urinalysis    Component Value Date/Time   COLORURINE RED (A) 12/28/2021 1944   APPEARANCEUR CLEAR 12/28/2021 1944   LABSPEC 1.004 (L) 12/28/2021 1944   PHURINE 8.0 12/28/2021 1944   GLUCOSEU 50 (A) 12/28/2021 1944   HGBUR MODERATE (A) 12/28/2021 1944   BILIRUBINUR NEGATIVE 12/28/2021 1944   KETONESUR NEGATIVE 12/28/2021 1944   PROTEINUR 100 (A) 12/28/2021 1944   NITRITE NEGATIVE 12/28/2021 1944   LEUKOCYTESUR SMALL (A) 12/28/2021 1944    Lab   Results  Component Value Date   BACTERIA NONE SEEN 12/28/2021    Pertinent Imaging:  No results found for this or any previous visit.  No results found for this or any previous visit.  No results found for this or any previous visit.  No results found for this or any previous visit.  No results found for this or any previous visit.  No results found for this or any previous visit.  No results found for this or any previous visit.  No results found for this or any previous visit.   Assessment & Plan:    1. Urinary retention We discussed the management of his BPH  including continued medical therapy, Rezum, Urolift, TURP and simple prostatectomy. After discussing the options the patient has elected to proceed with Urolift. Risks/benefits/alternatives discussed.    No follow-ups on file.  Wilkie Aye, MD  [facility] [place]

## 2022-02-11 NOTE — Patient Instructions (Signed)

## 2022-02-15 ENCOUNTER — Telehealth: Payer: Self-pay

## 2022-02-15 NOTE — Telephone Encounter (Signed)
I spoke with James Vazquez. We have discussed possible surgery dates and 02/28/2022 was agreed upon by all parties. Patient given information about surgery date, what to expect pre-operatively and post operatively.    We discussed that a pre-op nurse will be calling to set up the pre-op visit that will take place prior to surgery. Informed patient that our office will communicate any additional care to be provided after surgery.    Patients questions or concerns were discussed during our call. Advised to call our office should there be any additional information, questions or concerns that arise. Patient verbalized understanding.  2

## 2022-02-24 ENCOUNTER — Encounter (HOSPITAL_COMMUNITY)
Admission: RE | Admit: 2022-02-24 | Discharge: 2022-02-24 | Disposition: A | Discharge status: Home / Self Care | Payer: Medicare Other | Source: Ambulatory Visit | Attending: Urology | Admitting: Urology

## 2022-02-24 NOTE — Patient Instructions (Signed)
James Vazquez  02/24/2022     @PREFPERIOPPHARMACY @   Your procedure is scheduled on 02/28/22.  Report to [facility] at 7:30 A.M.  Call this number if you have problems the morning of surgery:  [phone]   Remember:  Do not eat or drink after midnight.     Take these medicines the morning of surgery with A SIP OF WATER : none    Do not wear jewelry, make-up or nail polish.  Do not wear lotions, powders, or perfumes, or deodorant.  Do not shave 48 hours prior to surgery.  Men may shave face and neck.  Do not bring valuables to the hospital.  [facility] is not responsible for any belongings or valuables.  Contacts, dentures or bridgework may not be worn into surgery.  Leave your suitcase in the car.  After surgery it may be brought to your room.  For patients admitted to the hospital, discharge time will be determined by your treatment team.  Patients discharged the day of surgery will not be allowed to drive home.   Name and phone number of your driver:   family Special instructions:  N/a  Please read over the following fact sheets that you were given. Care and Recovery After Surgery  Cystoscopy Cystoscopy is a procedure that is used to help diagnose and sometimes treat conditions that affect the lower urinary tract. The lower urinary tract includes the bladder and the urethra. The urethra is the tube that drains urine from the bladder. Cystoscopy is done using a thin, tube-shaped instrument with a light and camera at the end (cystoscope). The cystoscope may be hard or flexible, depending on the goal of the procedure. The cystoscope is inserted through the urethra, into the bladder. Cystoscopy may be recommended if you have: Urinary tract infections that keep coming back. Blood in the urine (hematuria). An inability to control when you urinate (urinary incontinence) or an overactive bladder. Unusual cells found in a urine sample. A blockage in the urethra, such as a  urinary stone. Painful urination. An abnormality in the bladder found during an intravenous pyelogram (IVP) or CT scan. Cystoscopy may also be done to remove a sample of tissue to be examined under a microscope (biopsy). Tell a health care provider about: Any allergies you have. All medicines you are taking, including vitamins, herbs, eye drops, creams, and over-the-counter medicines. Any problems you or family members have had with anesthetic medicines. Any blood disorders you have. Any surgeries you have had. Any medical conditions you have. Whether you are pregnant or may be pregnant. What are the risks? Generally, this is a safe procedure. However, problems may occur, including: Infection. Bleeding. Allergic reactions to medicines. Damage to other structures or organs. What happens before the procedure? Medicines Ask your health care provider about: Changing or stopping your regular medicines. This is especially important if you are taking diabetes medicines or blood thinners. Taking medicines such as aspirin and ibuprofen. These medicines can thin your blood. Do not take these medicines unless your health care provider tells you to take them. Taking over-the-counter medicines, vitamins, herbs, and supplements. Tests You may have an exam or testing, such as: X-rays of the bladder, urethra, or kidneys. CT scan of the abdomen or pelvis. Urine tests to check for signs of infection. General instructions Follow instructions from your health care provider about eating or drinking restrictions. Ask your health care provider what steps will be taken to help prevent infection. These steps may  include: Washing skin with a germ-killing soap. Taking antibiotic medicine. Plan to have a responsible adult take you home from the hospital or clinic. What happens during the procedure?  You will be given one or more of the following: A medicine to help you relax (sedative). A medicine to numb  the area (local anesthetic). The area around the opening of your urethra will be cleaned. The cystoscope will be passed through your urethra into your bladder. Germ-free (sterile) fluid will flow through the cystoscope to fill your bladder. The fluid will stretch your bladder so that your health care provider can clearly examine your bladder walls. Your doctor will look at the urethra and bladder. Your doctor may take a biopsy or remove stones. The cystoscope will be removed, and your bladder will be emptied. The procedure may vary among health care providers and hospitals. What can I expect after the procedure? After the procedure, it is common to have: Some soreness or pain in your abdomen and urethra. Urinary symptoms. These include: Mild pain or burning when you urinate. Pain should stop within a few minutes after you urinate. This may last for up to 1 week. A small amount of blood in your urine for several days. Feeling like you need to urinate but producing only a small amount of urine. Follow these instructions at home: Medicines Take over-the-counter and prescription medicines only as told by your health care provider. If you were prescribed an antibiotic medicine, take it as told by your health care provider. Do not stop taking the antibiotic even if you start to feel better. General instructions Return to your normal activities as told by your health care provider. Ask your health care provider what activities are safe for you. If you were given a sedative during the procedure, it can affect you for several hours. Do not drive or operate machinery until your health care provider says that it is safe. Watch for any blood in your urine. If the amount of blood in your urine increases, call your health care provider. Follow instructions from your health care provider about eating or drinking restrictions. If a tissue sample was removed for testing (biopsy) during your procedure, it is up  to you to get your test results. Ask your health care provider, or the department that is doing the test, when your results will be ready. Drink enough fluid to keep your urine pale yellow. Keep all follow-up visits. This is important. Contact a health care provider if: You have pain that gets worse or does not get better with medicine, especially pain when you urinate. You have trouble urinating. You have more blood in your urine. Get help right away if: You have blood clots in your urine. You have abdominal pain. You have a fever or chills. You are unable to urinate. Summary Cystoscopy is a procedure that is used to help diagnose and sometimes treat conditions that affect the lower urinary tract. Cystoscopy is done using a thin, tube-shaped instrument with a light and camera at the end. After the procedure, it is common to have some soreness or pain in your abdomen and urethra. Watch for any blood in your urine. If the amount of blood in your urine increases, call your health care provider. If you were prescribed an antibiotic medicine, take it as told by your health care provider. Do not stop taking the antibiotic even if you start to feel better. This information is not intended to replace advice given to you by your  health care provider. Make sure you discuss any questions you have with your health care provider. Document Revised: 01/27/2021 Document Reviewed: 12/27/2019 Elsevier Patient Education  2023 [employer]  General Anesthesia, Adult General anesthesia is the use of medicine to make you fall asleep (unconscious) for a medical procedure. General anesthesia must be used for certain procedures. It is often recommended for surgery or procedures that: Last a long time. Require you to be still or in an unusual position. Are major and can cause blood loss. Affect your breathing. The medicines used for general anesthesia are called general anesthetics. During general anesthesia,  these medicines are given along with medicines that: Prevent pain. Control your blood pressure. Relax your muscles. Prevent nausea and vomiting after the procedure. Tell a health care provider about: Any allergies you have. All medicines you are taking, including vitamins, herbs, eye drops, creams, and over-the-counter medicines. Your history of any: Medical conditions you have, including: High blood pressure. Bleeding problems. Diabetes. Heart or lung conditions, such as: Heart failure. Sleep apnea. Asthma. Chronic obstructive pulmonary disease (COPD). Current or recent illnesses, such as: Upper respiratory, chest, or ear infections. Cough or fever. Tobacco or drug use, including marijuana or alcohol use. Depression or anxiety. Surgeries and types of anesthetics you have had. Problems you or family members have had with anesthetic medicines. Whether you are pregnant or may be pregnant. Whether you have any chipped or loose teeth, dentures, caps, bridgework, or issues with your mouth, swallowing, or choking. What are the risks? Your health care provider will talk with you about risks. These may include: Allergic reaction to the medicines. Lung and heart problems. Inhaling food or liquid from the stomach into the lungs (aspiration). Nerve injury. Injury to the lips, mouth, teeth, or gums. Stroke. Waking up during your procedure and being unable to move. This is rare. These problems are more likely to develop if you are having a major surgery or if you have an advanced or serious medical condition. You can prevent some of these complications by answering all of your health care provider's questions thoroughly and by following all instructions before your procedure. General anesthesia can cause side effects, including: Nausea or vomiting. A sore throat or hoarseness from the breathing tube. Wheezing or coughing. Shaking chills or feeling cold. Body  aches. Sleepiness. Confusion, agitation (delirium), or anxiety. What happens before the procedure? When to stop eating and drinking Follow instructions from your health care provider about what you may eat and drink before your procedure. If you do not follow your health care provider's instructions, your procedure may be delayed or canceled. Medicines Ask your health care provider about: Changing or stopping your regular medicines. These include any diabetes medicines or blood thinners you take. Taking medicines such as aspirin and ibuprofen. These medicines can thin your blood. Do not take them unless your health care provider tells you to. Taking over-the-counter medicines, vitamins, herbs, and supplements. General instructions Do not use any products that contain nicotine or tobacco for at least 4 weeks before the procedure. These products include cigarettes, chewing tobacco, and vaping devices, such as e-cigarettes. If you need help quitting, ask your health care provider. If you brush your teeth on the morning of the procedure, make sure to spit out all of the water and toothpaste. If told by your health care provider, bring your sleep apnea device with you to surgery (if applicable). If you will be going home right after the procedure, plan to have a responsible adult:  Take you home from the hospital or clinic. You will not be allowed to drive. Care for you for the time you are told. What happens during the procedure?  An IV will be inserted into one of your veins. You will be given one or more of the following through a face mask or IV: A sedative. This helps you relax. Anesthesia. This will: Numb certain areas of your body. Make you fall asleep for surgery. After you are unconscious, a breathing tube may be inserted down your throat to help you breathe. This will be removed before you wake up. An anesthesia provider, such as an anesthesiologist, will stay with you throughout your  procedure. The anesthesia provider will: Keep you comfortable and safe by continuing to give you medicines and adjusting the amount of medicine that you get. Monitor your blood pressure, heart rate, and oxygen levels to make sure that the anesthetics do not cause any problems. The procedure may vary among health care providers and hospitals. What happens after the procedure? Your blood pressure, temperature, heart rate, breathing rate, and blood oxygen level will be monitored until you leave the hospital or clinic. You will wake up in a recovery area. You may wake up slowly. You may be given medicine to help you with pain, nausea, or any other side effects from the anesthesia. Summary General anesthesia is the use of medicine to make you fall asleep (unconscious) for a medical procedure. Follow your health care provider's instructions about when to stop eating, drinking, or taking certain medicines before your procedure. Plan to have a responsible adult take you home from the hospital or clinic. This information is not intended to replace advice given to you by your health care provider. Make sure you discuss any questions you have with your health care provider. Document Revised: 08/12/2021 Document Reviewed: 08/12/2021 Elsevier Patient Education  2023 [employer].

## 2022-02-28 ENCOUNTER — Encounter (HOSPITAL_COMMUNITY)
Admission: RE | Disposition: A | Discharge status: Home / Self Care | Payer: Self-pay | Source: Home / Self Care | Attending: Urology

## 2022-02-28 ENCOUNTER — Encounter (HOSPITAL_COMMUNITY): Payer: Self-pay | Admitting: Urology

## 2022-02-28 ENCOUNTER — Ambulatory Visit (HOSPITAL_BASED_OUTPATIENT_CLINIC_OR_DEPARTMENT_OTHER): Payer: Medicare Other | Admitting: Anesthesiology

## 2022-02-28 ENCOUNTER — Ambulatory Visit (HOSPITAL_COMMUNITY): Payer: Medicare Other | Admitting: Anesthesiology

## 2022-02-28 ENCOUNTER — Ambulatory Visit (HOSPITAL_COMMUNITY)
Admission: RE | Admit: 2022-02-28 | Discharge: 2022-02-28 | Disposition: A | Discharge status: Home / Self Care | Payer: Medicare Other | Attending: Urology | Admitting: Urology

## 2022-02-28 DIAGNOSIS — N138 Other obstructive and reflux uropathy: Secondary | ICD-10-CM | POA: Insufficient documentation

## 2022-02-28 DIAGNOSIS — R338 Other retention of urine: Secondary | ICD-10-CM | POA: Diagnosis not present

## 2022-02-28 DIAGNOSIS — K219 Gastro-esophageal reflux disease without esophagitis: Secondary | ICD-10-CM | POA: Insufficient documentation

## 2022-02-28 DIAGNOSIS — Z79899 Other long term (current) drug therapy: Secondary | ICD-10-CM | POA: Insufficient documentation

## 2022-02-28 DIAGNOSIS — I10 Essential (primary) hypertension: Secondary | ICD-10-CM | POA: Diagnosis not present

## 2022-02-28 DIAGNOSIS — N400 Enlarged prostate without lower urinary tract symptoms: Secondary | ICD-10-CM

## 2022-02-28 DIAGNOSIS — N401 Enlarged prostate with lower urinary tract symptoms: Secondary | ICD-10-CM | POA: Diagnosis present

## 2022-02-28 HISTORY — PX: CYSTOSCOPY WITH INSERTION OF UROLIFT: SHX6678

## 2022-02-28 LAB — GLUCOSE, CAPILLARY: Glucose-Capillary: 105 mg/dL — ABNORMAL HIGH (ref 70–99)

## 2022-02-28 SURGERY — CYSTOSCOPY WITH INSERTION OF UROLIFT
Anesthesia: General | Site: Prostate

## 2022-02-28 MED ORDER — FENTANYL CITRATE (PF) 100 MCG/2ML IJ SOLN
INTRAMUSCULAR | Status: DC | PRN
  Administered 2022-02-28: 50 ug via INTRAVENOUS

## 2022-02-28 MED ORDER — CHLORHEXIDINE GLUCONATE 0.12 % MT SOLN
15.0000 mL | Freq: Once | OROMUCOSAL | Status: AC
  Administered 2022-02-28: 15 mL via OROMUCOSAL

## 2022-02-28 MED ORDER — PROPOFOL 10 MG/ML IV BOLUS
INTRAVENOUS | Status: DC | PRN
  Administered 2022-02-28: 50 mg via INTRAVENOUS
  Administered 2022-02-28: 150 mg via INTRAVENOUS

## 2022-02-28 MED ORDER — EPHEDRINE SULFATE (PRESSORS) 50 MG/ML IJ SOLN
INTRAMUSCULAR | Status: DC | PRN
  Administered 2022-02-28: 5 mg via INTRAVENOUS

## 2022-02-28 MED ORDER — LIDOCAINE HCL (CARDIAC) PF 50 MG/5ML IV SOSY
PREFILLED_SYRINGE | INTRAVENOUS | Status: DC | PRN
  Administered 2022-02-28: 60 mg via INTRAVENOUS

## 2022-02-28 MED ORDER — WATER FOR IRRIGATION, STERILE IR SOLN
Status: DC | PRN
  Administered 2022-02-28: 500 mL
  Administered 2022-02-28: 3000 mL

## 2022-02-28 MED ORDER — EPHEDRINE 5 MG/ML INJ
INTRAVENOUS | Status: AC
  Filled 2022-02-28: qty 5

## 2022-02-28 MED ORDER — PROPOFOL 10 MG/ML IV BOLUS
INTRAVENOUS | Status: AC
  Filled 2022-02-28: qty 20

## 2022-02-28 MED ORDER — ONDANSETRON HCL 4 MG/2ML IJ SOLN
INTRAMUSCULAR | Status: DC | PRN
  Administered 2022-02-28: 4 mg via INTRAVENOUS

## 2022-02-28 MED ORDER — ORAL CARE MOUTH RINSE: 15.0000 mL | Freq: Once | OROMUCOSAL | Status: AC

## 2022-02-28 MED ORDER — DEXAMETHASONE SODIUM PHOSPHATE 10 MG/ML IJ SOLN
INTRAMUSCULAR | Status: DC | PRN
  Administered 2022-02-28: 5 mg via INTRAVENOUS

## 2022-02-28 MED ORDER — FENTANYL CITRATE (PF) 100 MCG/2ML IJ SOLN
INTRAMUSCULAR | Status: AC
  Filled 2022-02-28: qty 2

## 2022-02-28 MED ORDER — LACTATED RINGERS IV SOLN: INTRAVENOUS | Status: DC

## 2022-02-28 MED ORDER — CEFAZOLIN SODIUM-DEXTROSE 2-4 GM/100ML-% IV SOLN
2.0000 g | INTRAVENOUS | Status: AC
  Administered 2022-02-28: 2 g via INTRAVENOUS
  Filled 2022-02-28: qty 100

## 2022-02-28 MED ORDER — ONDANSETRON HCL 4 MG/2ML IJ SOLN
INTRAMUSCULAR | Status: AC
  Filled 2022-02-28: qty 4

## 2022-02-28 SURGICAL SUPPLY — 18 items
BAG DRAIN URO TABLE W/ADPT NS (BAG) ×1 IMPLANT
BAG DRN 8 ADPR NS SKTRN CSTL (BAG) ×1
BAG HAMPER (MISCELLANEOUS) ×1 IMPLANT
CLOTH BEACON ORANGE TIMEOUT ST (SAFETY) ×1 IMPLANT
GLOVE BIO SURGEON STRL SZ8 (GLOVE) ×1 IMPLANT
GLOVE BIOGEL PI IND STRL 7.0 (GLOVE) ×2 IMPLANT
GOWN STRL REUS W/TWL LRG LVL3 (GOWN DISPOSABLE) ×1 IMPLANT
GOWN STRL REUS W/TWL XL LVL3 (GOWN DISPOSABLE) ×1 IMPLANT
KIT TURNOVER CYSTO (KITS) ×1 IMPLANT
MANIFOLD NEPTUNE II (INSTRUMENTS) ×1 IMPLANT
PACK CYSTO (CUSTOM PROCEDURE TRAY) ×1 IMPLANT
PAD ARMBOARD 7.5X6 YLW CONV (MISCELLANEOUS) ×1 IMPLANT
SYSTEM UROLIFT (Male Continence) IMPLANT
TOWEL OR 17X26 4PK STRL BLUE (TOWEL DISPOSABLE) ×1 IMPLANT
TRAY FOLEY W/BAG SLVR 16FR (SET/KITS/TRAYS/PACK) ×1
TRAY FOLEY W/BAG SLVR 16FR ST (SET/KITS/TRAYS/PACK) ×1 IMPLANT
WATER STERILE IRR 3000ML UROMA (IV SOLUTION) ×1 IMPLANT
WATER STERILE IRR 500ML POUR (IV SOLUTION) ×1 IMPLANT

## 2022-02-28 NOTE — Anesthesia Procedure Notes (Addendum)
Procedure Name: LMA Insertion Date/Time: 02/28/2022 9:25 AM  Performed by: Vista Deck, CRNAPre-anesthesia Checklist: Patient identified, Patient being monitored, Timeout performed, Emergency Drugs available and Suction available Patient Re-evaluated:Patient Re-evaluated prior to induction Oxygen Delivery Method: Circle System Utilized Preoxygenation: Pre-oxygenation with 100% oxygen Induction Type: IV induction Ventilation: Mask ventilation without difficulty LMA: LMA inserted LMA Size: 4.0 Number of attempts: 1 Placement Confirmation: positive ETCO2 and breath sounds checked- equal and bilateral Tube secured with: Tape Dental Injury: Teeth and Oropharynx as per pre-operative assessment

## 2022-02-28 NOTE — Interval H&P Note (Signed)
History and Physical Interval Note:  02/28/2022 8:59 AM  James Vazquez  has presented today for surgery, with the diagnosis of BPH with urinary retention.  The various methods of treatment have been discussed with the patient and family. After consideration of risks, benefits and other options for treatment, the patient has consented to  Procedure(s): CYSTOSCOPY WITH INSERTION OF UROLIFT (N/A) as a surgical intervention.  The patient's history has been reviewed, patient examined, no change in status, stable for surgery.  I have reviewed the patient's chart and labs.  Questions were answered to the patient's satisfaction.     Nicolette Bang

## 2022-02-28 NOTE — Anesthesia Preprocedure Evaluation (Signed)
Anesthesia Evaluation  Patient identified by MRN, date of birth, ID band Patient awake    Reviewed: Allergy & Precautions, NPO status , Patient's Chart, lab work & pertinent test results  Airway Mallampati: III  TM Distance: >3 FB Neck ROM: Full    Dental  (+) Dental Advisory Given, Loose,    Pulmonary neg pulmonary ROS,    Pulmonary exam normal breath sounds clear to auscultation       Cardiovascular hypertension, Pt. on medications Normal cardiovascular exam Rhythm:Regular Rate:Normal     Neuro/Psych negative neurological ROS  negative psych ROS   GI/Hepatic Neg liver ROS, GERD  Medicated and Controlled,  Endo/Other  negative endocrine ROS  Renal/GU Renal disease  negative genitourinary   Musculoskeletal  (+) Arthritis , Osteoarthritis,    Abdominal   Peds negative pediatric ROS (+)  Hematology negative hematology ROS (+)   Anesthesia Other Findings   Reproductive/Obstetrics negative OB ROS                            Anesthesia Physical Anesthesia Plan  ASA: 2  Anesthesia Plan: General   Post-op Pain Management: Dilaudid IV   Induction: Intravenous  PONV Risk Score and Plan: 4 or greater and Ondansetron and Dexamethasone  Airway Management Planned: LMA and Oral ETT  Additional Equipment:   Intra-op Plan:   Post-operative Plan: Extubation in OR  Informed Consent: I have reviewed the patients History and Physical, chart, labs and discussed the procedure including the risks, benefits and alternatives for the proposed anesthesia with the patient or authorized representative who has indicated his/her understanding and acceptance.       Plan Discussed with: CRNA and Surgeon  Anesthesia Plan Comments:        Anesthesia Quick Evaluation

## 2022-02-28 NOTE — Anesthesia Postprocedure Evaluation (Signed)
Anesthesia Post Note  Patient: James Vazquez  Procedure(s) Performed: CYSTOSCOPY WITH INSERTION OF UROLIFT (Prostate)  Patient location during evaluation: Phase II Anesthesia Type: General Level of consciousness: awake and alert and oriented Pain management: pain level controlled Vital Signs Assessment: post-procedure vital signs reviewed and stable Respiratory status: spontaneous breathing, nonlabored ventilation and respiratory function stable Cardiovascular status: blood pressure returned to baseline and stable Postop Assessment: no apparent nausea or vomiting Anesthetic complications: no   No notable events documented.   Last Vitals:  Vitals:   02/28/22 1100 02/28/22 1109  BP: 137/81 (!) 153/89  Pulse: 66   Resp: 13 (!) 22  Temp:  36.8 C  SpO2: 98% 98%    Last Pain:  Vitals:   02/28/22 1105  TempSrc:   PainSc: 0-No pain                 James Vazquez

## 2022-02-28 NOTE — Transfer of Care (Signed)
Immediate Anesthesia Transfer of Care Note  Patient: James Vazquez  Procedure(s) Performed: CYSTOSCOPY WITH INSERTION OF UROLIFT (Prostate)  Patient Location: PACU  Anesthesia Type:General  Level of Consciousness: awake and patient cooperative  Airway & Oxygen Therapy: Patient Spontanous Breathing and Patient connected to nasal cannula oxygen  Post-op Assessment: Report given to RN and Post -op Vital signs reviewed and stable  Post vital signs: Reviewed and stable  Last Vitals:  Vitals Value Taken Time  BP 122/73 1009  Temp 97.7 1009  Pulse 77 1009  Resp 15 1009  SpO2 98 1009    Last Pain:  Vitals:   02/28/22 0752  TempSrc: Oral  PainSc:          Complications: No notable events documented.

## 2022-02-28 NOTE — Op Note (Signed)
   PREOPERATIVE DIAGNOSIS:  Benign prostatic hypertrophy with bladder outlet obstruction.  POSTOPERATIVE DIAGNOSIS:  Benign prostatic hypertrophy with bladder outlet obstruction.  PROCEDURE:  Cystoscopy with implantation of UroLift devices, 6 implants.  SURGEON:  Nicolette Bang, M.D.  ANESTHESIA:  General  ANTIBIOTICS: ancef  SPECIMEN:  None.  DRAINS:  A 16-French Foley catheter.  BLOOD LOSS:  Minimal.  COMPLICATIONS:  None.  INDICATIONS: The Patient is an 81 year old male with BPH and bladder outlet obstruction.  He has failed his voiding trial after Urolift.  We discussed TURP versus additional Urolift implants therapy and he has elected UroLift for definitive treatment.  FINDINGS OF PROCEDURE:  He was taken to the operating room where a general anesthetic was induced.  He was placed in lithotomy position and was fitted with PAS hose.  His perineum and genitalia were prepped with chlorhexidine, and he was draped in usual sterile fashion.  Cystoscopy was performed using the UroLift scope and 0 degree lens. Examination revealed a normal urethra.  The external sphincter was intact.  Prostatic urethra was approximately 5 cm in length with lateral lobe enlargement. There was also little bit of bladder neck elevation. Inspection of bladder revealed mild-to-moderate trabeculation with no tumors, stones, or inflammation.  No cellules or diverticula were noted. Ureteral orifices were in their normal anatomic position effluxing clear urine.  After initial cystoscopy, the visual obturator was replaced with the first UroLift device.  This was turned to the 9 o'clock position and pulled back to the veru and then slightly advanced.  Pressure was then applied to the right lateral lobe and the UroLift device was deployed.  The second UroLift device was then inserted and applied to the left lateral lobe at 3 o'clock and deployed in the mid prostatic urethra. After this, there was  still some apparent obstruction closer to the bladder neck.  So a second and third level of UroLift your left device was applied between the mid urethra and the proximal urethra providing further patency to the prostatic urethra.  At this point, there was mild bleeding but the patient did have a spinal anesthetic.  So it was thought that a Foley catheter was indicated.  The scope was removed and a 16-French Foley catheter was inserted without difficulty. The balloon was filled with 10 mL sterile fluid, and the catheter was placed to straight drainage.  COMPLICATIONS: None   CONDITION: Stable, extubated, transferred to PACU  PLAN: The patient will be discharged home and followup in 2 days for a voiding trial.

## 2022-03-02 ENCOUNTER — Ambulatory Visit: Payer: Medicare Other | Admitting: Physician Assistant

## 2022-03-02 VITALS — BP 159/79 | HR 80

## 2022-03-02 DIAGNOSIS — Z978 Presence of other specified devices: Secondary | ICD-10-CM

## 2022-03-02 DIAGNOSIS — Z466 Encounter for fitting and adjustment of urinary device: Secondary | ICD-10-CM | POA: Diagnosis not present

## 2022-03-02 DIAGNOSIS — N138 Other obstructive and reflux uropathy: Secondary | ICD-10-CM | POA: Diagnosis not present

## 2022-03-02 DIAGNOSIS — N401 Enlarged prostate with lower urinary tract symptoms: Secondary | ICD-10-CM

## 2022-03-02 LAB — BLADDER SCAN AMB NON-IMAGING: Scan Result: 499

## 2022-03-02 NOTE — Progress Notes (Signed)
Fill and Pull Catheter Removal  Patient is present today for a catheter removal.  Patient was cleaned and prepped in a sterile fashion 260ml of sterile water/ saline was instilled into the bladder when the patient felt the urge to urinate. 26ml of water was then drained from the balloon.  A 16FR foley cath was removed from the bladder no complications were noted .  Patient as then given some time to void on their own.  Patient can void  15ml on their own after some time.  Patient tolerated well.  Performed by: Marisue Brooklyn, CMA  Follow up/ Additional notes: PVR @2 :30pm  3:12 PM post void residual =499

## 2022-03-02 NOTE — Progress Notes (Signed)
Assessment: 1. Presence of indwelling Foley catheter - In and Out Cath - Bladder Voiding Trial  2. Benign prostatic hyperplasia with urinary obstruction    Plan: Pt will return this afternoon for PVR.  Due to incomplete emptying and pt's hx, will see him sooner than 1 month. Pt will resume Rapaflo as discussed and FU in 1 week for PVR and UA. ED precautions discussed.  Chief Complaint: No chief complaint on file.   HPI: James Vazquez is a 81 y.o. male who presents for post op evaluation of Urolift procedure performed on 02/28/22. Pt has tolerated foley well and is scheduled for VT. Pt able to void small amount after foley removed. He has stopped his Rapaflo. Advised to FU this afternoon for PVR.  4:11 PM Pt returned for PVR- 488ml without pain, urgency, or hematuria. Pt voided in office twice after PVR. Pt denies gross hematuria  02/11/22 James Vazquez is a 81yo here for followup for BPH with urinary retention. He underwent UDS which showed a 700cc capacity, max detrusor contraction 56cm of water.    Portions of the above documentation were copied from a prior visit for review purposes only.  Allergies: No Known Allergies  PMH: Past Medical History:  Diagnosis Date   Arthritis    GERD (gastroesophageal reflux disease)    Hypertension     PSH: Past Surgical History:  Procedure Laterality Date   CYSTOSCOPY WITH INSERTION OF UROLIFT N/A 12/27/2021   Procedure: CYSTOSCOPY WITH INSERTION OF UROLIFT;  Surgeon: Cleon Gustin, MD;  Location: [facility];  Service: Urology;  Laterality: N/A;   HERNIA REPAIR     TONSILLECTOMY      SH: Social History   Tobacco Use   Smoking status: Never   Smokeless tobacco: Never  Vaping Use   Vaping Use: Never used  Substance Use Topics   Alcohol use: Never   Drug use: Never    ROS: All other review of systems were reviewed and are negative except what is noted above in HPI  PE: BP (!) 159/79   Pulse 80  GENERAL APPEARANCE:   Well appearing, well developed, NAD HEENT:  Atraumatic, normocephalic NECK:  Supple. Trachea midline ABDOMEN:  Soft, non-tender, no masses, no CVAT EXTREMITIES:  Moves all extremities well NEUROLOGIC:  Alert and oriented x 3 MENTAL STATUS:  appropriate SKIN:  Warm, dry, and intact   Results: Laboratory Data: Lab Results  Component Value Date   WBC 13.6 (H) 12/28/2021   HGB 11.6 (L) 12/28/2021   HCT 35.5 (L) 12/28/2021   MCV 93.7 12/28/2021   PLT 255 12/28/2021    Lab Results  Component Value Date   CREATININE 1.06 12/28/2021    No results found for: "PSA"  No results found for: "TESTOSTERONE"  No results found for: "HGBA1C"  Urinalysis    Component Value Date/Time   COLORURINE RED (A) 12/28/2021 1944   APPEARANCEUR CLEAR 12/28/2021 1944   LABSPEC 1.004 (L) 12/28/2021 1944   PHURINE 8.0 12/28/2021 1944   GLUCOSEU 50 (A) 12/28/2021 1944   HGBUR MODERATE (A) 12/28/2021 1944   BILIRUBINUR NEGATIVE 12/28/2021 1944   KETONESUR NEGATIVE 12/28/2021 1944   PROTEINUR 100 (A) 12/28/2021 1944   NITRITE NEGATIVE 12/28/2021 1944   LEUKOCYTESUR SMALL (A) 12/28/2021 1944    Lab Results  Component Value Date   BACTERIA NONE SEEN 12/28/2021    Pertinent Imaging: No results found for this or any previous visit.  No results found for this or any previous visit.  No results found for this or any previous visit.  No results found for this or any previous visit.  No results found for this or any previous visit.  No valid procedures specified. No results found for this or any previous visit.  No results found for this or any previous visit.  No results found for this or any previous visit (from the past 24 hour(s)).

## 2022-03-04 ENCOUNTER — Encounter (HOSPITAL_COMMUNITY): Payer: Self-pay | Admitting: Urology

## 2022-03-11 ENCOUNTER — Ambulatory Visit (INDEPENDENT_AMBULATORY_CARE_PROVIDER_SITE_OTHER): Payer: Medicare Other | Admitting: Urology

## 2022-03-11 VITALS — BP 148/84 | HR 66

## 2022-03-11 DIAGNOSIS — R339 Retention of urine, unspecified: Secondary | ICD-10-CM

## 2022-03-11 DIAGNOSIS — N138 Other obstructive and reflux uropathy: Secondary | ICD-10-CM

## 2022-03-11 DIAGNOSIS — N401 Enlarged prostate with lower urinary tract symptoms: Secondary | ICD-10-CM

## 2022-03-11 DIAGNOSIS — N3001 Acute cystitis with hematuria: Secondary | ICD-10-CM

## 2022-03-11 LAB — POCT URINALYSIS DIPSTICK
Bilirubin, UA: NEGATIVE
Glucose, UA: NEGATIVE
Ketones, UA: NEGATIVE
Nitrite, UA: POSITIVE
Protein, UA: POSITIVE — AB
Spec Grav, UA: 1.01 (ref 1.010–1.025)
Urobilinogen, UA: 0.2 E.U./dL
pH, UA: 5.5 (ref 5.0–8.0)

## 2022-03-11 MED ORDER — POLYETHYLENE GLYCOL 3350 17 GM/SCOOP PO POWD: 1.0000 | Freq: Once | ORAL | 0 refills | Status: AC

## 2022-03-11 MED ORDER — SULFAMETHOXAZOLE-TRIMETHOPRIM 800-160 MG PO TABS: 1.0000 | ORAL_TABLET | Freq: Two times a day (BID) | ORAL | 0 refills | Status: DC

## 2022-03-11 NOTE — Progress Notes (Unsigned)
post void residual=1052

## 2022-03-11 NOTE — Progress Notes (Unsigned)
03/11/2022 10:47 AM   Cloretta Ned Wojnar Sep 24, 1940 [id number]  Referring provider: Abran Richard, MD [address] [place],  [place] [address]  Followup urinary retention  HPI: Mr Hunting is a 81yo here for followup for BPH with urinary retention. He has been severely constipation for the past 3 weeks. He is currently on rapaflo 8mg  daily. He underwent Urolift 10/4. Urine stream is strong per patient. PVR 1L. He does not want to replace the foley   PMH: Past Medical History:  Diagnosis Date   Arthritis    GERD (gastroesophageal reflux disease)    Hypertension     Surgical History: Past Surgical History:  Procedure Laterality Date   CYSTOSCOPY WITH INSERTION OF UROLIFT N/A 12/27/2021   Procedure: CYSTOSCOPY WITH INSERTION OF UROLIFT;  Surgeon: Cleon Gustin, MD;  Location: [facility];  Service: Urology;  Laterality: N/A;   CYSTOSCOPY WITH INSERTION OF UROLIFT N/A 02/28/2022   Procedure: CYSTOSCOPY WITH INSERTION OF UROLIFT;  Surgeon: Cleon Gustin, MD;  Location: [facility];  Service: Urology;  Laterality: N/A;   HERNIA REPAIR     TONSILLECTOMY      Home Medications:  Allergies as of 03/11/2022   No Known Allergies      Medication List        Accurate as of March 11, 2022 10:47 AM. If you have any questions, ask your nurse or doctor.          ascorbic acid 500 MG tablet Commonly known as: VITAMIN C Take 500 mg by mouth daily.   doxycycline 100 MG capsule Commonly known as: VIBRAMYCIN Take 1 capsule (100 mg total) by mouth every 12 (twelve) hours.   multivitamin with minerals tablet Take 1 tablet by mouth daily.   pantoprazole 40 MG tablet Commonly known as: PROTONIX Take 1 tablet (40 mg total) by mouth daily before breakfast.   polyvinyl alcohol 1.4 % ophthalmic solution Commonly known as: LIQUIFILM TEARS Place 1 drop into both eyes as needed for dry eyes.   silodosin 8 MG Caps capsule Commonly known as: RAPAFLO Take 1 capsule (8 mg total)  by mouth daily with breakfast.        Allergies: No Known Allergies  Family History: No family history on file.  Social History:  reports that he has never smoked. He has never used smokeless tobacco. He reports that he does not drink alcohol and does not use drugs.  ROS: All other review of systems were reviewed and are negative except what is noted above in HPI  Physical Exam: BP (!) 148/84   Pulse 66   Constitutional:  Alert and oriented, No acute distress. HEENT: [place] AT, moist mucus membranes.  Trachea midline, no masses. Cardiovascular: No clubbing, cyanosis, or edema. Respiratory: Normal respiratory effort, no increased work of breathing. GI: Abdomen is soft, nontender, nondistended, no abdominal masses GU: No CVA tenderness.  Lymph: No cervical or inguinal lymphadenopathy. Skin: No rashes, bruises or suspicious lesions. Neurologic: Grossly intact, no focal deficits, moving all 4 extremities. Psychiatric: Normal mood and affect.  Laboratory Data: Lab Results  Component Value Date   WBC 13.6 (H) 12/28/2021   HGB 11.6 (L) 12/28/2021   HCT 35.5 (L) 12/28/2021   MCV 93.7 12/28/2021   PLT 255 12/28/2021    Lab Results  Component Value Date   CREATININE 1.06 12/28/2021    No results found for: "PSA"  No results found for: "TESTOSTERONE"  No results found for: "HGBA1C"  Urinalysis  Component Value Date/Time   COLORURINE RED (A) 12/28/2021 1944   APPEARANCEUR CLEAR 12/28/2021 1944   LABSPEC 1.004 (L) 12/28/2021 1944   PHURINE 8.0 12/28/2021 1944   GLUCOSEU 50 (A) 12/28/2021 1944   HGBUR MODERATE (A) 12/28/2021 1944   BILIRUBINUR NEGATIVE 12/28/2021 1944   KETONESUR NEGATIVE 12/28/2021 1944   PROTEINUR 100 (A) 12/28/2021 1944   NITRITE NEGATIVE 12/28/2021 1944   LEUKOCYTESUR SMALL (A) 12/28/2021 1944    Lab Results  Component Value Date   BACTERIA NONE SEEN 12/28/2021    Pertinent Imaging:  No results found for this or any previous visit.  No  results found for this or any previous visit.  No results found for this or any previous visit.  No results found for this or any previous visit.  No results found for this or any previous visit.  No valid procedures specified. No results found for this or any previous visit.  No results found for this or any previous visit.   Assessment & Plan:    1. Benign prostatic hyperplasia with urinary obstruction -RTC 1 week for PVR - BLADDER SCAN AMB NON-IMAGING - POCT urinalysis dipstick  2. Urinary retention -bactrim DS BID for 7 days   No follow-ups on file.  Nicolette Bang, MD  [facility] [place]

## 2022-03-15 ENCOUNTER — Ambulatory Visit: Payer: Medicare Other | Admitting: Physician Assistant

## 2022-03-15 ENCOUNTER — Encounter: Payer: Self-pay | Admitting: Urology

## 2022-03-15 ENCOUNTER — Encounter: Payer: Self-pay | Admitting: Physician Assistant

## 2022-03-15 VITALS — BP 135/80 | HR 86

## 2022-03-15 DIAGNOSIS — N401 Enlarged prostate with lower urinary tract symptoms: Secondary | ICD-10-CM

## 2022-03-15 DIAGNOSIS — N138 Other obstructive and reflux uropathy: Secondary | ICD-10-CM

## 2022-03-15 LAB — URINE CULTURE

## 2022-03-15 LAB — BLADDER SCAN AMB NON-IMAGING: Scan Result: >847

## 2022-03-15 NOTE — Patient Instructions (Signed)

## 2022-03-15 NOTE — Progress Notes (Signed)
Assessment: 1. Benign prostatic hyperplasia with urinary obstruction - BLADDER SCAN AMB NON-IMAGING - Urinalysis, Routine w reflex microscopic    Plan: *However we will complete his Bactrim DS prescription and continue increasing fluid intake as well as silodosin.  Because he continues to hold onto a significant amount of urine, will recheck a PVR and urinalysis in approximately 2 weeks.  If he develops new symptoms or concerns before he will let us know.   Chief Complaint: No chief complaint on file.   HPI: James Vazquez is a 81 y.o. male who presents for continued evaluation of BPH with obstruction.  The patient is status post UroLift procedure x 2 weeks.  His initial voiding trial on postop day 2 patient had almost 500 mL per recheck PVR.  He was adamant not to have the Foley reinserted and follow-up last week found 1 L of urine on PVR.  Currently, he remains on Rapaflo.  Urine culture from 814 grew Klebsiella aerogenes greater than 100,000 colonies, resistant to Augmentin, cefazolin, cefuroxime, nitrofurantoin.  Patient was treated with Bactrim DS.  Today, he states he is feeling much better and no longer has lower abdominal discomfort or issues with voiding.  He feels no urgency.  No gross hematuria.  UA= greater than 30 WBCs without bacteria nitrite negative PVR= >847 mL  Portions of the above documentation were copied from a prior visit for review purposes only.  Allergies: No Known Allergies  PMH: Past Medical History:  Diagnosis Date   Arthritis    GERD (gastroesophageal reflux disease)    Hypertension     PSH: Past Surgical History:  Procedure Laterality Date   CYSTOSCOPY WITH INSERTION OF UROLIFT N/A 12/27/2021   Procedure: CYSTOSCOPY WITH INSERTION OF UROLIFT;  Surgeon: Cleon Gustin, MD;  Location: [facility];  Service: Urology;  Laterality: N/A;   CYSTOSCOPY WITH INSERTION OF UROLIFT N/A 02/28/2022   Procedure: CYSTOSCOPY WITH INSERTION OF UROLIFT;   Surgeon: Cleon Gustin, MD;  Location: [facility];  Service: Urology;  Laterality: N/A;   HERNIA REPAIR     TONSILLECTOMY      SH: Social History   Tobacco Use   Smoking status: Never   Smokeless tobacco: Never  Vaping Use   Vaping Use: Never used  Substance Use Topics   Alcohol use: Never   Drug use: Never    ROS: All other review of systems were reviewed and are negative except what is noted above in HPI  PE: There were no vitals taken for this visit. GENERAL APPEARANCE:  Well appearing, well developed, well nourished, NAD HEENT:  Atraumatic, normocephalic NECK:  Supple. Trachea midline ABDOMEN:  Soft, non-tender, no masses EXTREMITIES:  Moves all extremities well, without clubbing, cyanosis, or edema NEUROLOGIC:  Alert and oriented x 3, normal gait, CN II-XII grossly intact MENTAL STATUS:  appropriate BACK:  Non-tender to palpation, No CVAT SKIN:  Warm, dry, and intact   Results: Laboratory Data: Lab Results  Component Value Date   WBC 13.6 (H) 12/28/2021   HGB 11.6 (L) 12/28/2021   HCT 35.5 (L) 12/28/2021   MCV 93.7 12/28/2021   PLT 255 12/28/2021    Lab Results  Component Value Date   CREATININE 1.06 12/28/2021    No results found for: "PSA"  No results found for: "TESTOSTERONE"  No results found for: "HGBA1C"  Urinalysis    Component Value Date/Time   COLORURINE RED (A) 12/28/2021 1944   APPEARANCEUR CLEAR 12/28/2021 1944   LABSPEC 1.004 (L)  12/28/2021 1944   PHURINE 8.0 12/28/2021 1944   GLUCOSEU 50 (A) 12/28/2021 1944   HGBUR MODERATE (A) 12/28/2021 1944   BILIRUBINUR neg 03/11/2022 1249   KETONESUR NEGATIVE 12/28/2021 1944   PROTEINUR Positive (A) 03/11/2022 1249   PROTEINUR 100 (A) 12/28/2021 1944   UROBILINOGEN 0.2 03/11/2022 1249   NITRITE Positive 03/11/2022 1249   NITRITE NEGATIVE 12/28/2021 1944   LEUKOCYTESUR Large (3+) (A) 03/11/2022 1249   LEUKOCYTESUR SMALL (A) 12/28/2021 1944    Lab Results  Component Value Date    BACTERIA NONE SEEN 12/28/2021    Pertinent Imaging: No results found for this or any previous visit.  No results found for this or any previous visit.  No results found for this or any previous visit.  No results found for this or any previous visit.  No results found for this or any previous visit.  No valid procedures specified. No results found for this or any previous visit.  No results found for this or any previous visit.  No results found for this or any previous visit (from the past 24 hour(s)).

## 2022-03-15 NOTE — Progress Notes (Signed)
4:01 PM  post void residual =>847

## 2022-03-16 LAB — URINALYSIS, ROUTINE W REFLEX MICROSCOPIC
Bilirubin, UA: NEGATIVE
Glucose, UA: NEGATIVE
Ketones, UA: NEGATIVE
Nitrite, UA: NEGATIVE
Specific Gravity, UA: 1.01 (ref 1.005–1.030)
Urobilinogen, Ur: 0.2 mg/dL (ref 0.2–1.0)
pH, UA: 5 (ref 5.0–7.5)

## 2022-03-16 LAB — MICROSCOPIC EXAMINATION
Bacteria, UA: NONE SEEN
WBC, UA: >30 /hpf — AB (ref 0–5)

## 2022-04-04 ENCOUNTER — Other Ambulatory Visit: Payer: Self-pay | Admitting: Physician Assistant

## 2022-04-04 ENCOUNTER — Ambulatory Visit: Payer: Medicare Other | Admitting: Physician Assistant

## 2022-04-04 ENCOUNTER — Encounter: Payer: Self-pay | Admitting: Physician Assistant

## 2022-04-04 VITALS — BP 136/81 | HR 101

## 2022-04-04 DIAGNOSIS — N401 Enlarged prostate with lower urinary tract symptoms: Secondary | ICD-10-CM | POA: Diagnosis not present

## 2022-04-04 DIAGNOSIS — R8281 Pyuria: Secondary | ICD-10-CM

## 2022-04-04 DIAGNOSIS — R339 Retention of urine, unspecified: Secondary | ICD-10-CM | POA: Diagnosis not present

## 2022-04-04 DIAGNOSIS — N138 Other obstructive and reflux uropathy: Secondary | ICD-10-CM

## 2022-04-04 DIAGNOSIS — N9989 Other postprocedural complications and disorders of genitourinary system: Secondary | ICD-10-CM | POA: Diagnosis not present

## 2022-04-04 LAB — POCT URINALYSIS DIPSTICK
Bilirubin, UA: NEGATIVE
Glucose, UA: NEGATIVE
Ketones, UA: NEGATIVE
Protein, UA: POSITIVE — AB
Spec Grav, UA: 1.01 (ref 1.010–1.025)
Urobilinogen, UA: 0.2 E.U./dL
pH, UA: 5 (ref 5.0–8.0)

## 2022-04-04 LAB — BLADDER SCAN AMB NON-IMAGING: Scan Result: >649

## 2022-04-04 MED ORDER — SULFAMETHOXAZOLE-TRIMETHOPRIM 800-160 MG PO TABS: 1.0000 | ORAL_TABLET | Freq: Two times a day (BID) | ORAL | 0 refills | Status: DC

## 2022-04-04 NOTE — Patient Instructions (Signed)
Miralax nightly Add Senakot S over the counter as needed

## 2022-04-04 NOTE — Progress Notes (Signed)
Assessment: 1. Benign prostatic hyperplasia with urinary obstruction - BLADDER SCAN AMB NON-IMAGING - POCT urinalysis dipstick - Urine Culture  2. Pyuria    Plan: Urine cx ordered and will start Bactrim. FU in 3 weeks for UA and PVR to ensure infection cleared.  Discussed treatment options for constipation at length.  He will start MiraLAX at night and recommended Senokot S as needed.  Meds ordered this encounter  Medications   sulfamethoxazole-trimethoprim (BACTRIM DS) 800-160 MG tablet    Sig: Take 1 tablet by mouth 2 (two) times daily.    Dispense:  14 tablet    Refill:  0     Chief Complaint: No chief complaint on file.   HPI: James Vazquez is a 81 y.o. male who presents for continued evaluation of incomplete emptying post UroLift procedure on 02/28/2022.  He is now 1 month out and states he is doing much better with decrease in urinary frequency, urgency.  He feels his stream is much stronger and he has improved with nocturia from 6-7 times down to 4.  He denies burning, dysuria, gross hematuria.  Urine culture from 1017 grew Klebsiella, resistant to Augmentin, cefazolin, cefuroxime, Macrobid. Pt also admits to ongoing constipation.  UA= nitrite positive with 3+ leukoesterase PVR= 649 mL, improved from 1 L noted on 03/11/2022 IPSS= 9         QOL=1  Portions of the above documentation were copied from a prior visit for review purposes only.  Allergies: No Known Allergies  PMH: Past Medical History:  Diagnosis Date   Arthritis    GERD (gastroesophageal reflux disease)    Hypertension     PSH: Past Surgical History:  Procedure Laterality Date   CYSTOSCOPY WITH INSERTION OF UROLIFT N/A 12/27/2021   Procedure: CYSTOSCOPY WITH INSERTION OF UROLIFT;  Surgeon: Malen Gauze, MD;  Location: [facility];  Service: Urology;  Laterality: N/A;   CYSTOSCOPY WITH INSERTION OF UROLIFT N/A 02/28/2022   Procedure: CYSTOSCOPY WITH INSERTION OF UROLIFT;  Surgeon: Malen Gauze, MD;  Location: [facility];  Service: Urology;  Laterality: N/A;   HERNIA REPAIR     TONSILLECTOMY      SH: Social History   Tobacco Use   Smoking status: Never   Smokeless tobacco: Never  Vaping Use   Vaping Use: Never used  Substance Use Topics   Alcohol use: Never   Drug use: Never    ROS: All other review of systems were reviewed and are negative except what is noted above in HPI  PE: BP 136/81   Pulse (!) 101  GENERAL APPEARANCE:  Well appearing, well developed, well nourished, NAD HEENT:  Atraumatic, normocephalic NECK:  Supple. Trachea midline ABDOMEN:  Soft, non-tender, no masses EXTREMITIES:  Moves all extremities well, without clubbing, cyanosis, or edema NEUROLOGIC:  Alert and oriented x 3 MENTAL STATUS:  appropriate BACK:  Non-tender to palpation, No CVAT SKIN:  Warm, dry, and intact   Results: Laboratory Data: Lab Results  Component Value Date   WBC 13.6 (H) 12/28/2021   HGB 11.6 (L) 12/28/2021   HCT 35.5 (L) 12/28/2021   MCV 93.7 12/28/2021   PLT 255 12/28/2021    Lab Results  Component Value Date   CREATININE 1.06 12/28/2021    No results found for: "PSA"  No results found for: "TESTOSTERONE"  No results found for: "HGBA1C"  Urinalysis    Component Value Date/Time   COLORURINE RED (A) 12/28/2021 1944   APPEARANCEUR Cloudy (A) 03/15/2022 1553  LABSPEC 1.004 (L) 12/28/2021 1944   PHURINE 8.0 12/28/2021 1944   GLUCOSEU Negative 03/15/2022 1553   HGBUR MODERATE (A) 12/28/2021 1944   BILIRUBINUR Negative 03/15/2022 1553   KETONESUR NEGATIVE 12/28/2021 1944   PROTEINUR 1+ (A) 03/15/2022 1553   PROTEINUR 100 (A) 12/28/2021 1944   UROBILINOGEN 0.2 03/11/2022 1249   NITRITE Negative 03/15/2022 1553   NITRITE NEGATIVE 12/28/2021 1944   LEUKOCYTESUR 2+ (A) 03/15/2022 1553   LEUKOCYTESUR SMALL (A) 12/28/2021 1944    Lab Results  Component Value Date   LABMICR See below: 03/15/2022   WBCUA >30 (A) 03/15/2022   LABEPIT 0-10  03/15/2022   BACTERIA None seen 03/15/2022    Pertinent Imaging: No results found for this or any previous visit.  No results found for this or any previous visit.  No results found for this or any previous visit.  No results found for this or any previous visit.  No results found for this or any previous visit.  No valid procedures specified. No results found for this or any previous visit.  No results found for this or any previous visit.  Results for orders placed or performed in visit on 04/04/22 (from the past 24 hour(s))  BLADDER SCAN AMB NON-IMAGING   Collection Time: 04/04/22  2:20 PM  Result Value Ref Range   Scan Result >649

## 2022-04-04 NOTE — Progress Notes (Signed)
2:21 PM post void residual >649

## 2022-04-07 LAB — URINE CULTURE

## 2022-04-11 ENCOUNTER — Telehealth: Payer: Self-pay

## 2022-04-11 NOTE — Telephone Encounter (Signed)
Made patient aware that his urine cx indicates the antibx he is on should cover the bacteria well. Patient voiced understanding

## 2022-04-11 NOTE — Telephone Encounter (Signed)
-----   Message from Sydnee Levans, [place] sent at 04/11/2022 11:09 AM EST ----- Please let pt know his urine cx indicates the antibx he is on should cover the bacteria well. ----- Message ----- From: Interface, [facility] Lab Results In Sent: 04/07/2022  10:36 AM EST To: Sydnee Levans, PA-C

## 2022-04-25 ENCOUNTER — Ambulatory Visit: Payer: Medicare Other | Admitting: Urology

## 2022-04-25 ENCOUNTER — Encounter: Payer: Self-pay | Admitting: Urology

## 2022-04-25 VITALS — BP 146/82 | HR 98

## 2022-04-25 DIAGNOSIS — N138 Other obstructive and reflux uropathy: Secondary | ICD-10-CM | POA: Diagnosis not present

## 2022-04-25 DIAGNOSIS — R339 Retention of urine, unspecified: Secondary | ICD-10-CM

## 2022-04-25 DIAGNOSIS — N401 Enlarged prostate with lower urinary tract symptoms: Secondary | ICD-10-CM

## 2022-04-25 LAB — MICROSCOPIC EXAMINATION
Epithelial Cells (non renal): NONE SEEN /hpf (ref 0–10)
WBC, UA: >30 /hpf — AB (ref 0–5)

## 2022-04-25 LAB — URINALYSIS, ROUTINE W REFLEX MICROSCOPIC
Bilirubin, UA: NEGATIVE
Glucose, UA: NEGATIVE
Ketones, UA: NEGATIVE
Nitrite, UA: NEGATIVE
Specific Gravity, UA: <1.005 — ABNORMAL LOW (ref 1.005–1.030)
Urobilinogen, Ur: 0.2 mg/dL (ref 0.2–1.0)
pH, UA: 5 (ref 5.0–7.5)

## 2022-04-25 MED ORDER — SILODOSIN 8 MG PO CAPS: 8.0000 mg | ORAL_CAPSULE | Freq: Every day | ORAL | 11 refills | Status: DC

## 2022-04-25 NOTE — Patient Instructions (Signed)

## 2022-04-25 NOTE — Progress Notes (Signed)
post void residual=588

## 2022-04-25 NOTE — Progress Notes (Signed)
04/25/2022 1:52 PM   James Vazquez February 24, 1941 [id number]  Referring provider: Alvina Filbert, MD [address] [place],  [place] [address]  No chief complaint on file.   HPI: James Vazquez is a 81yo here for followup for incomplete emptying and BPH. PVR 58. He notes difficulty urinating with his constipation. He started taking miralax 17g daily  IPSS 7 QOL 1. Urine stream strong. Nocturia 3x. No other complaints today   PMH: Past Medical History:  Diagnosis Date   Arthritis    GERD (gastroesophageal reflux disease)    Hypertension     Surgical History: Past Surgical History:  Procedure Laterality Date   CYSTOSCOPY WITH INSERTION OF UROLIFT N/A 12/27/2021   Procedure: CYSTOSCOPY WITH INSERTION OF UROLIFT;  Surgeon: Malen Gauze, MD;  Location: [facility];  Service: Urology;  Laterality: N/A;   CYSTOSCOPY WITH INSERTION OF UROLIFT N/A 02/28/2022   Procedure: CYSTOSCOPY WITH INSERTION OF UROLIFT;  Surgeon: Malen Gauze, MD;  Location: [facility];  Service: Urology;  Laterality: N/A;   HERNIA REPAIR     TONSILLECTOMY      Home Medications:  Allergies as of 04/25/2022   No Known Allergies      Medication List        Accurate as of April 25, 2022  1:52 PM. If you have any questions, ask your nurse or doctor.          ascorbic acid 500 MG tablet Commonly known as: VITAMIN C Take 500 mg by mouth daily.   multivitamin with minerals tablet Take 1 tablet by mouth daily.   polyvinyl alcohol 1.4 % ophthalmic solution Commonly known as: LIQUIFILM TEARS Place 1 drop into both eyes as needed for dry eyes.   silodosin 8 MG Caps capsule Commonly known as: RAPAFLO Take 1 capsule (8 mg total) by mouth daily with breakfast.   sulfamethoxazole-trimethoprim 800-160 MG tablet Commonly known as: BACTRIM DS Take 1 tablet by mouth 2 (two) times daily.        Allergies: No Known Allergies  Family History: No family history on file.  Social History:  reports  that he has never smoked. He has never used smokeless tobacco. He reports that he does not drink alcohol and does not use drugs.  ROS: All other review of systems were reviewed and are negative except what is noted above in HPI  Physical Exam: BP (!) 146/82   Pulse 98   Constitutional:  Alert and oriented, No acute distress. HEENT: [place] AT, moist mucus membranes.  Trachea midline, no masses. Cardiovascular: No clubbing, cyanosis, or edema. Respiratory: Normal respiratory effort, no increased work of breathing. GI: Abdomen is soft, nontender, nondistended, no abdominal masses GU: No CVA tenderness.  Lymph: No cervical or inguinal lymphadenopathy. Skin: No rashes, bruises or suspicious lesions. Neurologic: Grossly intact, no focal deficits, moving all 4 extremities. Psychiatric: Normal mood and affect.  Laboratory Data: Lab Results  Component Value Date   WBC 13.6 (H) 12/28/2021   HGB 11.6 (L) 12/28/2021   HCT 35.5 (L) 12/28/2021   MCV 93.7 12/28/2021   PLT 255 12/28/2021    Lab Results  Component Value Date   CREATININE 1.06 12/28/2021    No results found for: "PSA"  No results found for: "TESTOSTERONE"  No results found for: "HGBA1C"  Urinalysis    Component Value Date/Time   COLORURINE RED (A) 12/28/2021 1944   APPEARANCEUR Cloudy (A) 03/15/2022 1553   LABSPEC 1.004 (L) 12/28/2021 1944   PHURINE  8.0 12/28/2021 1944   GLUCOSEU Negative 03/15/2022 1553   HGBUR MODERATE (A) 12/28/2021 1944   BILIRUBINUR negative 04/04/2022 1446   BILIRUBINUR Negative 03/15/2022 1553   KETONESUR NEGATIVE 12/28/2021 1944   PROTEINUR Positive (A) 04/04/2022 1446   PROTEINUR 1+ (A) 03/15/2022 1553   PROTEINUR 100 (A) 12/28/2021 1944   UROBILINOGEN 0.2 04/04/2022 1446   NITRITE postive 04/04/2022 1446   NITRITE Negative 03/15/2022 1553   NITRITE NEGATIVE 12/28/2021 1944   LEUKOCYTESUR Large (3+) (A) 04/04/2022 1446   LEUKOCYTESUR 2+ (A) 03/15/2022 1553   LEUKOCYTESUR SMALL (A)  12/28/2021 1944    Lab Results  Component Value Date   LABMICR See below: 03/15/2022   WBCUA >30 (A) 03/15/2022   LABEPIT 0-10 03/15/2022   BACTERIA None seen 03/15/2022    Pertinent Imaging:  No results found for this or any previous visit.  No results found for this or any previous visit.  No results found for this or any previous visit.  No results found for this or any previous visit.  No results found for this or any previous visit.  No valid procedures specified. No results found for this or any previous visit.  No results found for this or any previous visit.   Assessment & Plan:    1. Incomplete emptying of bladder RTC 3 months with PVR - BLADDER SCAN AMB NON-IMAGING - Urinalysis, Routine w reflex microscopic  2. Benign prostatic hyperplasia with urinary obstruction -continue rapaflo 8mg  daily   No follow-ups on file.  , MD  [facility] [place]

## 2022-07-25 ENCOUNTER — Ambulatory Visit: Payer: Medicare Other | Admitting: Urology

## 2022-07-25 ENCOUNTER — Encounter: Payer: Self-pay | Admitting: Urology

## 2022-07-25 VITALS — BP 153/88 | HR 102

## 2022-07-25 DIAGNOSIS — R3912 Poor urinary stream: Secondary | ICD-10-CM | POA: Diagnosis not present

## 2022-07-25 DIAGNOSIS — N401 Enlarged prostate with lower urinary tract symptoms: Secondary | ICD-10-CM

## 2022-07-25 DIAGNOSIS — R339 Retention of urine, unspecified: Secondary | ICD-10-CM | POA: Diagnosis not present

## 2022-07-25 DIAGNOSIS — R8281 Pyuria: Secondary | ICD-10-CM | POA: Diagnosis not present

## 2022-07-25 DIAGNOSIS — N138 Other obstructive and reflux uropathy: Secondary | ICD-10-CM

## 2022-07-25 LAB — BLADDER SCAN AMB NON-IMAGING: Scan Result: 475

## 2022-07-25 MED ORDER — SILODOSIN 8 MG PO CAPS: 8.0000 mg | ORAL_CAPSULE | Freq: Every day | ORAL | 11 refills | Status: DC

## 2022-07-25 NOTE — Patient Instructions (Signed)

## 2022-07-25 NOTE — Progress Notes (Unsigned)
07/25/2022 1:43 PM   James Vazquez 1940/11/20 [id number]  Referring provider: Abran Richard, MD [address] [place],  [place] [address]  No chief complaint on file.   HPI: Nocturia 2x. IPSS 2 QOL 0. Uirne stream strong. No straining to urinate.    PMH: Past Medical History:  Diagnosis Date   Arthritis    GERD (gastroesophageal reflux disease)    Hypertension     Surgical History: Past Surgical History:  Procedure Laterality Date   CYSTOSCOPY WITH INSERTION OF UROLIFT N/A 12/27/2021   Procedure: CYSTOSCOPY WITH INSERTION OF UROLIFT;  Surgeon: Cleon Gustin, MD;  Location: [facility];  Service: Urology;  Laterality: N/A;   CYSTOSCOPY WITH INSERTION OF UROLIFT N/A 02/28/2022   Procedure: CYSTOSCOPY WITH INSERTION OF UROLIFT;  Surgeon: Cleon Gustin, MD;  Location: [facility];  Service: Urology;  Laterality: N/A;   HERNIA REPAIR     TONSILLECTOMY      Home Medications:  Allergies as of 07/25/2022   No Known Allergies      Medication List        Accurate as of July 25, 2022  1:43 PM. If you have any questions, ask your nurse or doctor.          ascorbic acid 500 MG tablet Commonly known as: VITAMIN C Take 500 mg by mouth daily.   multivitamin with minerals tablet Take 1 tablet by mouth daily.   polyvinyl alcohol 1.4 % ophthalmic solution Commonly known as: LIQUIFILM TEARS Place 1 drop into both eyes as needed for dry eyes.   silodosin 8 MG Caps capsule Commonly known as: RAPAFLO Take 1 capsule (8 mg total) by mouth daily with breakfast.   sulfamethoxazole-trimethoprim 800-160 MG tablet Commonly known as: BACTRIM DS Take 1 tablet by mouth 2 (two) times daily.        Allergies: No Known Allergies  Family History: No family history on file.  Social History:  reports that he has never smoked. He has never used smokeless tobacco. He reports that he does not drink alcohol and does not use drugs.  ROS: All other review of systems  were reviewed and are negative except what is noted above in HPI  Physical Exam: BP (!) 153/88   Pulse (!) 102   Constitutional:  Alert and oriented, No acute distress. HEENT: [place] AT, moist mucus membranes.  Trachea midline, no masses. Cardiovascular: No clubbing, cyanosis, or edema. Respiratory: Normal respiratory effort, no increased work of breathing. GI: Abdomen is soft, nontender, nondistended, no abdominal masses GU: No CVA tenderness.  Lymph: No cervical or inguinal lymphadenopathy. Skin: No rashes, bruises or suspicious lesions. Neurologic: Grossly intact, no focal deficits, moving all 4 extremities. Psychiatric: Normal mood and affect.  Laboratory Data: Lab Results  Component Value Date   WBC 13.6 (H) 12/28/2021   HGB 11.6 (L) 12/28/2021   HCT 35.5 (L) 12/28/2021   MCV 93.7 12/28/2021   PLT 255 12/28/2021    Lab Results  Component Value Date   CREATININE 1.06 12/28/2021    No results found for: "PSA"  No results found for: "TESTOSTERONE"  No results found for: "HGBA1C"  Urinalysis    Component Value Date/Time   COLORURINE RED (A) 12/28/2021 1944   APPEARANCEUR Cloudy (A) 04/25/2022 1341   LABSPEC 1.004 (L) 12/28/2021 1944   PHURINE 8.0 12/28/2021 1944   GLUCOSEU Negative 04/25/2022 1341   HGBUR MODERATE (A) 12/28/2021 1944   BILIRUBINUR Negative 04/25/2022 1341   KETONESUR NEGATIVE 12/28/2021  [place] 2+ (A) 04/25/2022 1341   PROTEINUR 100 (A) 12/28/2021 1944   UROBILINOGEN 0.2 04/04/2022 1446   NITRITE Negative 04/25/2022 1341   NITRITE NEGATIVE 12/28/2021 1944   LEUKOCYTESUR 3+ (A) 04/25/2022 1341   LEUKOCYTESUR SMALL (A) 12/28/2021 1944    Lab Results  Component Value Date   LABMICR See below: 04/25/2022   WBCUA >30 (A) 04/25/2022   LABEPIT None seen 04/25/2022   BACTERIA Moderate (A) 04/25/2022    Pertinent Imaging: *** No results found for this or any previous visit.  No results found for this or any previous visit.  No  results found for this or any previous visit.  No results found for this or any previous visit.  No results found for this or any previous visit.  No valid procedures specified. No results found for this or any previous visit.  No results found for this or any previous visit.   Assessment & Plan:    1. Incomplete emptying of bladder *** - Urinalysis, Routine w reflex microscopic - BLADDER SCAN AMB NON-IMAGING  2. Benign prostatic hyperplasia with urinary obstruction ***  3. Weak urine stream ***  4. Acute cystitis -Urine for culture   No follow-ups on file.  Nicolette Bang, MD  [facility] [place]

## 2022-07-25 NOTE — Progress Notes (Unsigned)
post void residual=475

## 2022-07-26 LAB — URINALYSIS, ROUTINE W REFLEX MICROSCOPIC
Bilirubin, UA: NEGATIVE
Glucose, UA: NEGATIVE
Ketones, UA: NEGATIVE
Nitrite, UA: NEGATIVE
Specific Gravity, UA: 1.01 (ref 1.005–1.030)
Urobilinogen, Ur: 0.2 mg/dL (ref 0.2–1.0)
pH, UA: 6 (ref 5.0–7.5)

## 2022-07-26 LAB — MICROSCOPIC EXAMINATION: WBC, UA: >30 /hpf — AB (ref 0–5)

## 2022-07-28 ENCOUNTER — Telehealth: Payer: Self-pay

## 2022-07-28 LAB — URINE CULTURE

## 2022-07-28 MED ORDER — SULFAMETHOXAZOLE-TRIMETHOPRIM 800-160 MG PO TABS: 1.0000 | ORAL_TABLET | Freq: Two times a day (BID) | ORAL | 0 refills | Status: DC

## 2022-07-28 NOTE — Telephone Encounter (Signed)
Patient called and made aware of positive urine culture and Bactrim DS sent to pharmacy.

## 2023-01-18 ENCOUNTER — Ambulatory Visit: Payer: Medicare Other | Admitting: Urology

## 2023-01-18 VITALS — BP 162/70 | HR 71

## 2023-01-18 DIAGNOSIS — N3001 Acute cystitis with hematuria: Secondary | ICD-10-CM

## 2023-01-18 DIAGNOSIS — R339 Retention of urine, unspecified: Secondary | ICD-10-CM

## 2023-01-18 DIAGNOSIS — N138 Other obstructive and reflux uropathy: Secondary | ICD-10-CM | POA: Diagnosis not present

## 2023-01-18 DIAGNOSIS — N401 Enlarged prostate with lower urinary tract symptoms: Secondary | ICD-10-CM

## 2023-01-18 DIAGNOSIS — Z87440 Personal history of urinary (tract) infections: Secondary | ICD-10-CM | POA: Diagnosis not present

## 2023-01-18 LAB — URINALYSIS, ROUTINE W REFLEX MICROSCOPIC
Bilirubin, UA: NEGATIVE
Ketones, UA: NEGATIVE
Nitrite, UA: NEGATIVE
Protein,UA: NEGATIVE
RBC, UA: NEGATIVE
Specific Gravity, UA: 1.01 (ref 1.005–1.030)
Urobilinogen, Ur: 0.2 mg/dL (ref 0.2–1.0)
pH, UA: 6 (ref 5.0–7.5)

## 2023-01-18 LAB — BLADDER SCAN AMB NON-IMAGING: Scan Result: 279

## 2023-01-18 LAB — MICROSCOPIC EXAMINATION

## 2023-01-18 MED ORDER — SILODOSIN 8 MG PO CAPS: 8.0000 mg | ORAL_CAPSULE | Freq: Every day | ORAL | 11 refills | Status: DC

## 2023-01-18 NOTE — Progress Notes (Signed)
01/18/2023 10:52 AM   James Vazquez Jun 19, 1940 [id number]  Referring provider: Alvina Filbert, MD [address] [place],  [place] [address]  Followup recurrent UTI and BPH   HPI: James Vazquez is a 82yo here for followup for BPH and recurrent UTI. No UTIs since last visit. He takes rapaflo 8mg  every other day. IPSS 4 QOL 0. He takes miralax 17g daily which significantly improves his constipation. He has nocturia 1-3x depending on fluid consumption. He is doing well after urolift   PMH: Past Medical History:  Diagnosis Date   Arthritis    GERD (gastroesophageal reflux disease)    Hypertension     Surgical History: Past Surgical History:  Procedure Laterality Date   CYSTOSCOPY WITH INSERTION OF UROLIFT N/A 12/27/2021   Procedure: CYSTOSCOPY WITH INSERTION OF UROLIFT;  Surgeon: Malen Gauze, MD;  Location: [facility];  Service: Urology;  Laterality: N/A;   CYSTOSCOPY WITH INSERTION OF UROLIFT N/A 02/28/2022   Procedure: CYSTOSCOPY WITH INSERTION OF UROLIFT;  Surgeon: Malen Gauze, MD;  Location: [facility];  Service: Urology;  Laterality: N/A;   HERNIA REPAIR     TONSILLECTOMY      Home Medications:  Allergies as of 01/18/2023   No Known Allergies      Medication List        Accurate as of January 18, 2023 10:52 AM. If you have any questions, ask your nurse or doctor.          ascorbic acid 500 MG tablet Commonly known as: VITAMIN C Take 500 mg by mouth daily.   multivitamin with minerals tablet Take 1 tablet by mouth daily.   polyvinyl alcohol 1.4 % ophthalmic solution Commonly known as: LIQUIFILM TEARS Place 1 drop into both eyes as needed for dry eyes.   silodosin 8 MG Caps capsule Commonly known as: RAPAFLO Take 1 capsule (8 mg total) by mouth daily with breakfast.   sulfamethoxazole-trimethoprim 800-160 MG tablet Commonly known as: BACTRIM DS Take 1 tablet by mouth 2 (two) times daily.   sulfamethoxazole-trimethoprim 800-160 MG  tablet Commonly known as: BACTRIM DS Take 1 tablet by mouth every 12 (twelve) hours.        Allergies: No Known Allergies  Family History: No family history on file.  Social History:  reports that he has never smoked. He has never used smokeless tobacco. He reports that he does not drink alcohol and does not use drugs.  ROS: All other review of systems were reviewed and are negative except what is noted above in HPI  Physical Exam: BP (!) 162/70   Pulse 71   Constitutional:  Alert and oriented, No acute distress. HEENT: [place] AT, moist mucus membranes.  Trachea midline, no masses. Cardiovascular: No clubbing, cyanosis, or edema. Respiratory: Normal respiratory effort, no increased work of breathing. GI: Abdomen is soft, nontender, nondistended, no abdominal masses GU: No CVA tenderness.  Lymph: No cervical or inguinal lymphadenopathy. Skin: No rashes, bruises or suspicious lesions. Neurologic: Grossly intact, no focal deficits, moving all 4 extremities. Psychiatric: Normal mood and affect.  Laboratory Data: Lab Results  Component Value Date   WBC 13.6 (H) 12/28/2021   HGB 11.6 (L) 12/28/2021   HCT 35.5 (L) 12/28/2021   MCV 93.7 12/28/2021   PLT 255 12/28/2021    Lab Results  Component Value Date   CREATININE 1.06 12/28/2021    No results found for: "PSA"  No results found for: "TESTOSTERONE"  No results found for: "HGBA1C"  Urinalysis  Component Value Date/Time   COLORURINE RED (A) 12/28/2021 1944   APPEARANCEUR Cloudy (A) 07/25/2022 1322   LABSPEC 1.004 (L) 12/28/2021 1944   PHURINE 8.0 12/28/2021 1944   GLUCOSEU Negative 07/25/2022 1322   HGBUR MODERATE (A) 12/28/2021 1944   BILIRUBINUR Negative 07/25/2022 1322   KETONESUR NEGATIVE 12/28/2021 1944   PROTEINUR 2+ (A) 07/25/2022 1322   PROTEINUR 100 (A) 12/28/2021 1944   UROBILINOGEN 0.2 04/04/2022 1446   NITRITE Negative 07/25/2022 1322   NITRITE NEGATIVE 12/28/2021 1944   LEUKOCYTESUR 3+ (A)  07/25/2022 1322   LEUKOCYTESUR SMALL (A) 12/28/2021 1944    Lab Results  Component Value Date   LABMICR See below: 07/25/2022   WBCUA >30 (A) 07/25/2022   LABEPIT 0-10 07/25/2022   BACTERIA Many (A) 07/25/2022    Pertinent Imaging:  No results found for this or any previous visit.  No results found for this or any previous visit.  No results found for this or any previous visit.  No results found for this or any previous visit.  No results found for this or any previous visit.  No valid procedures specified. No results found for this or any previous visit.  No results found for this or any previous visit.   Assessment & Plan:    1. Incomplete emptying of bladder Continue rapaflo 8mg  QOD - Urinalysis, Routine w reflex microscopic - BLADDER SCAN AMB NON-IMAGING  2. Benign prostatic hyperplasia with urinary obstruction -continue rapaflo 8mg  QOD  3. Acute cystitis with hematuria -continue observation   No follow-ups on file.  Wilkie Aye, MD  [facility] [place]

## 2023-01-18 NOTE — Progress Notes (Signed)
post void residual=279

## 2023-01-21 LAB — URINE CULTURE

## 2023-01-24 ENCOUNTER — Encounter: Payer: Self-pay | Admitting: Urology

## 2023-01-24 NOTE — Patient Instructions (Signed)

## 2023-01-25 ENCOUNTER — Telehealth: Payer: Self-pay

## 2023-01-25 MED ORDER — SULFAMETHOXAZOLE-TRIMETHOPRIM 800-160 MG PO TABS: 1.0000 | ORAL_TABLET | Freq: Two times a day (BID) | ORAL | 0 refills | Status: DC

## 2023-01-25 NOTE — Telephone Encounter (Signed)
Rx sent. Patient called and made aware. 

## 2023-01-25 NOTE — Telephone Encounter (Signed)
-----   Message from Wilkie Aye sent at 01/25/2023  4:23 PM EDT ----- Bactrim DS ----- Message ----- From: Gustavus Messing, LPN Sent: 3/47/4259   9:21 AM EDT To: Malen Gauze, MD  No treatment started.

## 2024-01-24 ENCOUNTER — Ambulatory Visit: Payer: Medicare Other | Admitting: Urology

## 2024-01-24 ENCOUNTER — Encounter: Payer: Self-pay | Admitting: Urology

## 2024-01-24 VITALS — BP 135/64 | HR 71

## 2024-01-24 DIAGNOSIS — R338 Other retention of urine: Secondary | ICD-10-CM | POA: Diagnosis not present

## 2024-01-24 DIAGNOSIS — N401 Enlarged prostate with lower urinary tract symptoms: Secondary | ICD-10-CM

## 2024-01-24 DIAGNOSIS — N138 Other obstructive and reflux uropathy: Secondary | ICD-10-CM

## 2024-01-24 DIAGNOSIS — R339 Retention of urine, unspecified: Secondary | ICD-10-CM

## 2024-01-24 LAB — URINALYSIS, ROUTINE W REFLEX MICROSCOPIC
Bilirubin, UA: NEGATIVE
Ketones, UA: NEGATIVE
Nitrite, UA: NEGATIVE
Protein,UA: NEGATIVE
RBC, UA: NEGATIVE
Specific Gravity, UA: 1.01 (ref 1.005–1.030)
Urobilinogen, Ur: 0.2 mg/dL (ref 0.2–1.0)
pH, UA: 6.5 (ref 5.0–7.5)

## 2024-01-24 LAB — MICROSCOPIC EXAMINATION: Bacteria, UA: NONE SEEN

## 2024-01-24 LAB — BLADDER SCAN AMB NON-IMAGING: Scan Result: 306

## 2024-01-24 MED ORDER — SILODOSIN 8 MG PO CAPS
8.0000 mg | ORAL_CAPSULE | Freq: Every day | ORAL | 11 refills | Status: AC
Start: 2024-01-24 — End: ?

## 2024-01-24 NOTE — Patient Instructions (Signed)

## 2024-01-24 NOTE — Progress Notes (Signed)
 01/24/2024 10:35 AM   James Vazquez 06/12/1940 [id number]  Referring provider: Katrinka Aquas, MD [address] [place],  [place] [address]  No chief complaint on file.   HPI: James Vazquez is a 83yo here for followup for BPH with incomplete emptying. PVR 306. IPSS 3 QOL 1 on rapaflo  8mg  every other day. His constipation has resolved. He denies any abdominal pain. Nocturia 1-2x. Urine stream strong. He is doing well after Urolift.     PMH: Past Medical History:  Diagnosis Date   Arthritis    GERD (gastroesophageal reflux disease)    Hypertension     Surgical History: Past Surgical History:  Procedure Laterality Date   CYSTOSCOPY WITH INSERTION OF UROLIFT N/A 12/27/2021   Procedure: CYSTOSCOPY WITH INSERTION OF UROLIFT;  Surgeon: Sherrilee Belvie CROME, MD;  Location: [facility];  Service: Urology;  Laterality: N/A;   CYSTOSCOPY WITH INSERTION OF UROLIFT N/A 02/28/2022   Procedure: CYSTOSCOPY WITH INSERTION OF UROLIFT;  Surgeon: Sherrilee Belvie CROME, MD;  Location: [facility];  Service: Urology;  Laterality: N/A;   HERNIA REPAIR     TONSILLECTOMY      Home Medications:  Allergies as of 01/24/2024   No Known Allergies      Medication List        Accurate as of January 24, 2024 10:35 AM. If you have any questions, ask your nurse or doctor.          STOP taking these medications    sulfamethoxazole -trimethoprim  800-160 MG tablet Commonly known as: BACTRIM  DS       TAKE these medications    artificial tears ophthalmic solution Place 1 drop into both eyes as needed for dry eyes.   ascorbic acid 500 MG tablet Commonly known as: VITAMIN C Take 500 mg by mouth daily.   multivitamin with minerals tablet Take 1 tablet by mouth daily.   silodosin  8 MG Caps capsule Commonly known as: RAPAFLO  Take 1 capsule (8 mg total) by mouth daily with breakfast.        Allergies: No Known Allergies  Family History: No family history on file.  Social History:  reports that  he has never smoked. He has never used smokeless tobacco. He reports that he does not drink alcohol and does not use drugs.  ROS: All other review of systems were reviewed and are negative except what is noted above in HPI  Physical Exam: BP 135/64   Pulse 71   Constitutional:  Alert and oriented, No acute distress. HEENT: [place] AT, moist mucus membranes.  Trachea midline, no masses. Cardiovascular: No clubbing, cyanosis, or edema. Respiratory: Normal respiratory effort, no increased work of breathing. GI: Abdomen is soft, nontender, nondistended, no abdominal masses GU: No CVA tenderness.  Lymph: No cervical or inguinal lymphadenopathy. Skin: No rashes, bruises or suspicious lesions. Neurologic: Grossly intact, no focal deficits, moving all 4 extremities. Psychiatric: Normal mood and affect.  Laboratory Data: Lab Results  Component Value Date   WBC 13.6 (H) 12/28/2021   HGB 11.6 (L) 12/28/2021   HCT 35.5 (L) 12/28/2021   MCV 93.7 12/28/2021   PLT 255 12/28/2021    Lab Results  Component Value Date   CREATININE 1.06 12/28/2021    No results found for: PSA  No results found for: TESTOSTERONE  No results found for: HGBA1C  Urinalysis    Component Value Date/Time   COLORURINE RED (A) 12/28/2021 1944   APPEARANCEUR Clear 01/18/2023 1041   LABSPEC 1.004 (L) 12/28/2021 1944  PHURINE 8.0 12/28/2021 1944   GLUCOSEU Trace (A) 01/18/2023 1041   HGBUR MODERATE (A) 12/28/2021 1944   BILIRUBINUR Negative 01/18/2023 1041   KETONESUR NEGATIVE 12/28/2021 1944   PROTEINUR Negative 01/18/2023 1041   PROTEINUR 100 (A) 12/28/2021 1944   UROBILINOGEN 0.2 04/04/2022 1446   NITRITE Negative 01/18/2023 1041   NITRITE NEGATIVE 12/28/2021 1944   LEUKOCYTESUR 2+ (A) 01/18/2023 1041   LEUKOCYTESUR SMALL (A) 12/28/2021 1944    Lab Results  Component Value Date   LABMICR See below: 01/18/2023   WBCUA 11-30 (A) 01/18/2023   LABEPIT 0-10 01/18/2023   BACTERIA Few (A) 01/18/2023     Pertinent Imaging:  No results found for this or any previous visit.  No results found for this or any previous visit.  No results found for this or any previous visit.  No results found for this or any previous visit.  No results found for this or any previous visit.  No results found for this or any previous visit.  No results found for this or any previous visit.  No results found for this or any previous visit.   Assessment & Plan:    1. Benign prostatic hyperplasia with urinary obstruction (Primary) -continue rapaflo  8mg  QOD - Urinalysis, Routine w reflex microscopic - BLADDER SCAN AMB NON-IMAGING  2. Incomplete emptying of bladder Continue rapaflo  8mg  QOD - Urinalysis, Routine w reflex microscopic - BLADDER SCAN AMB NON-IMAGING   No follow-ups on file.  Belvie Clara, MD  [facility] [place]

## 2024-01-24 NOTE — Progress Notes (Signed)
 Bladder Scan completed today.  Patient can void prior to the bladder scan. Bladder scan result: [id number]  Performed By: [facility] LPN

## 2024-01-29 IMAGING — CT CT ABD-PELV W/O CM
2 of 4 series · 15 of 46 positions shown, 17 images · non-contrast
Comparison: None Available.

CLINICAL DATA: Abdominal pain.  Urinary tract infection



[Series 2: axial st · axial · 0.94mm/px · z∈[+540,+990]mm · 12 of 104 slices shown, 14 images]
[im 7/104  soft-tissue]
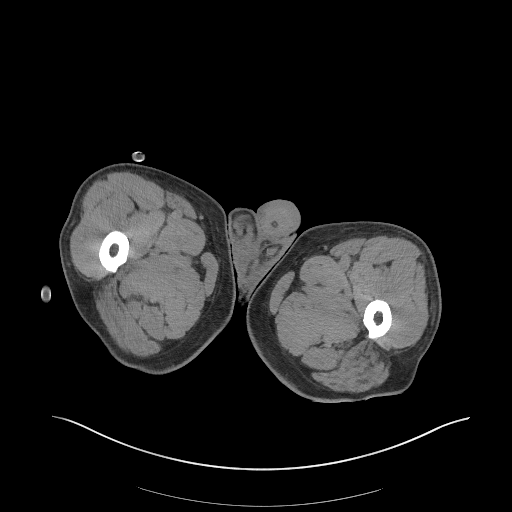
[im 7/104  bone]
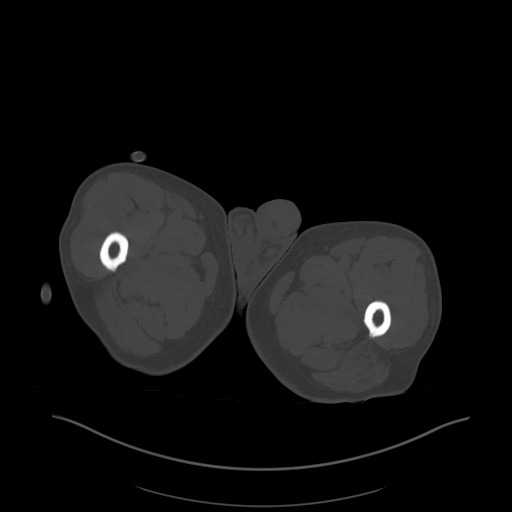
[im 14/104  soft-tissue]
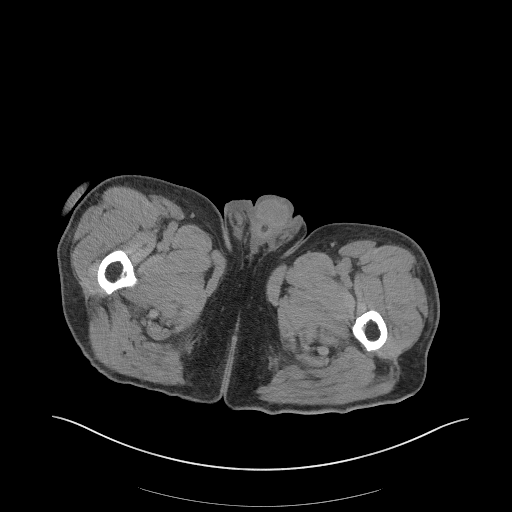
[im 21/104  soft-tissue]
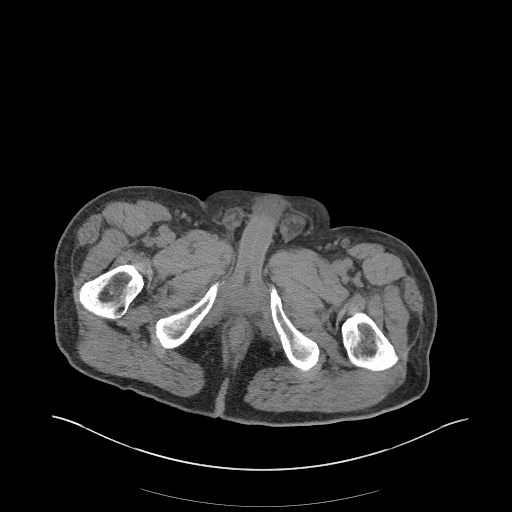
[im 35/104  soft-tissue]
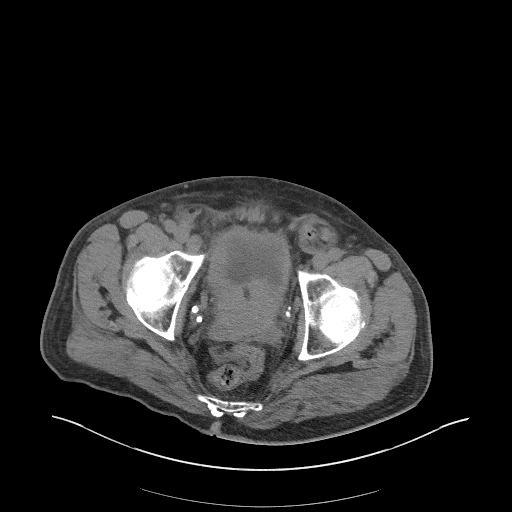
[im 42/104  soft-tissue]
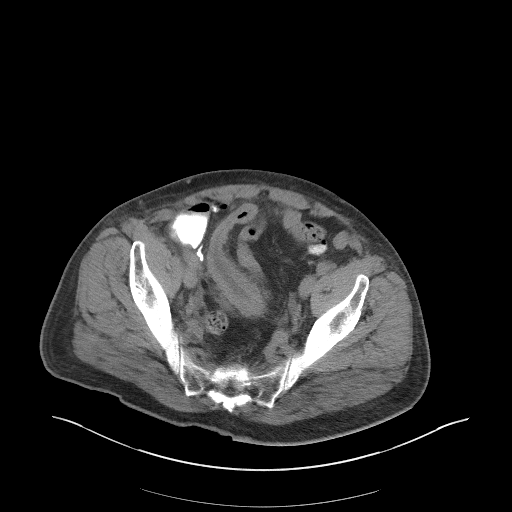
[im 49/104  soft-tissue]
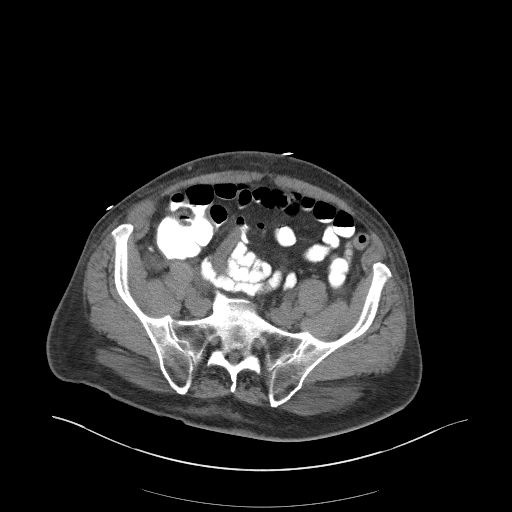
[im 55/104  soft-tissue]
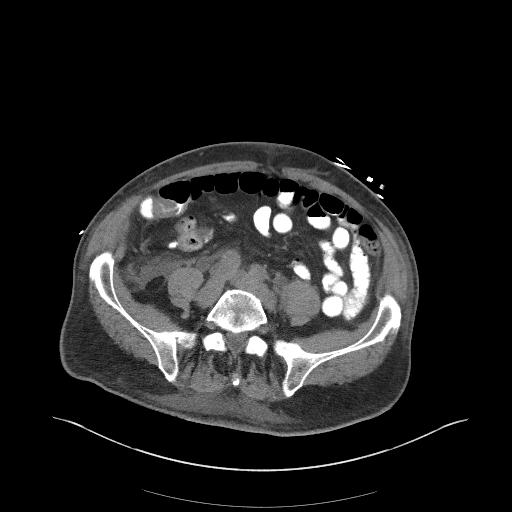
[im 62/104  soft-tissue]
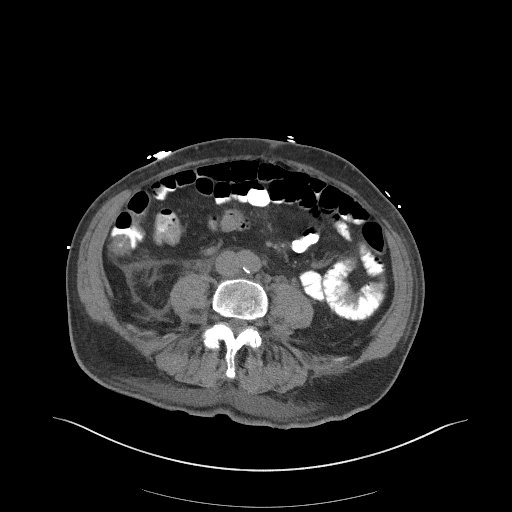
[im 69/104  soft-tissue]
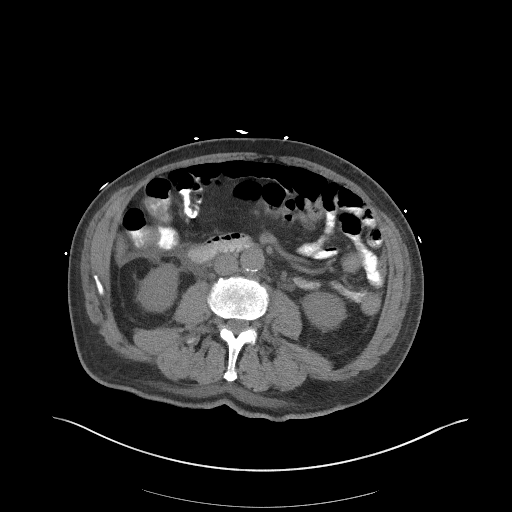
[im 69/104  bone]
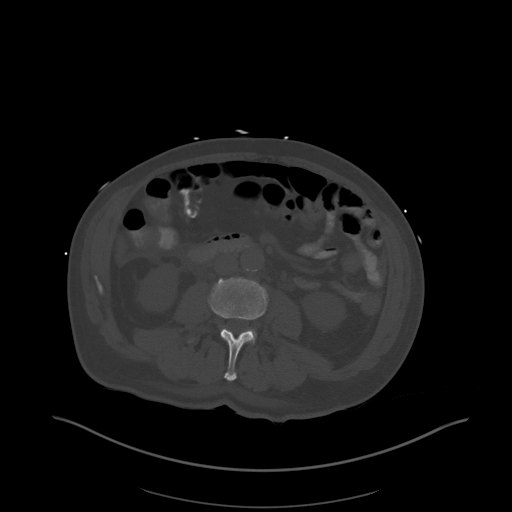
[im 83/104  soft-tissue]
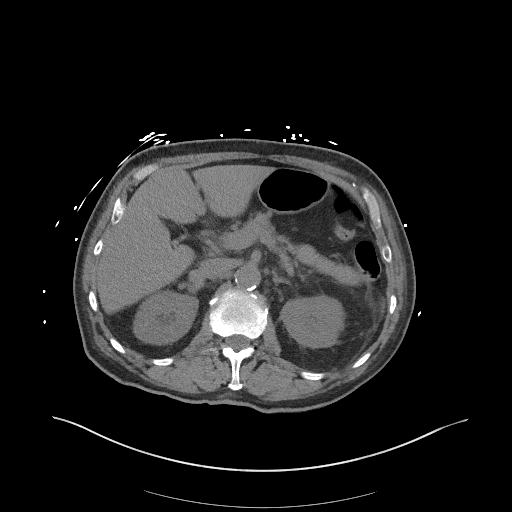
[im 90/104  soft-tissue]
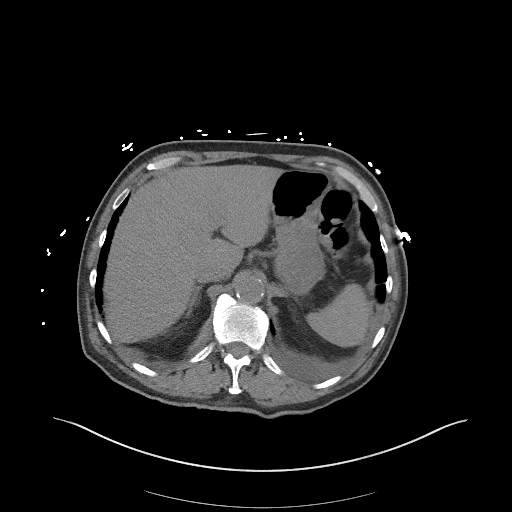
[im 97/104  soft-tissue]
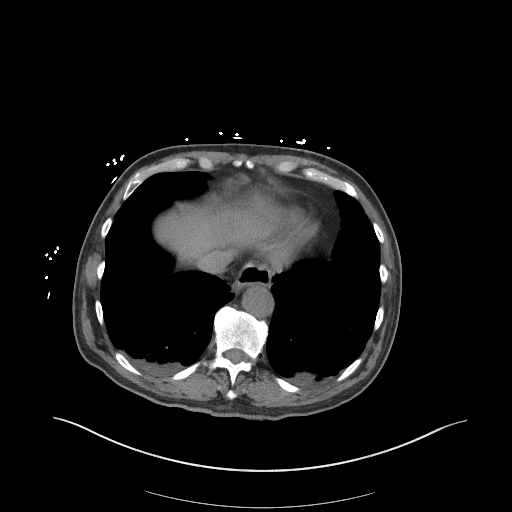

[Series 5: coronal st · coronal · 0.78mm/px · 3 of 97 slices shown]
[im 33/97  soft-tissue]
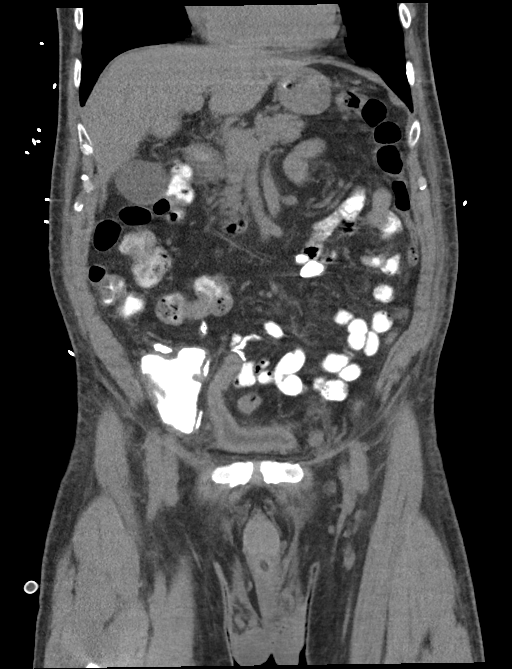
[im 43/97  soft-tissue]
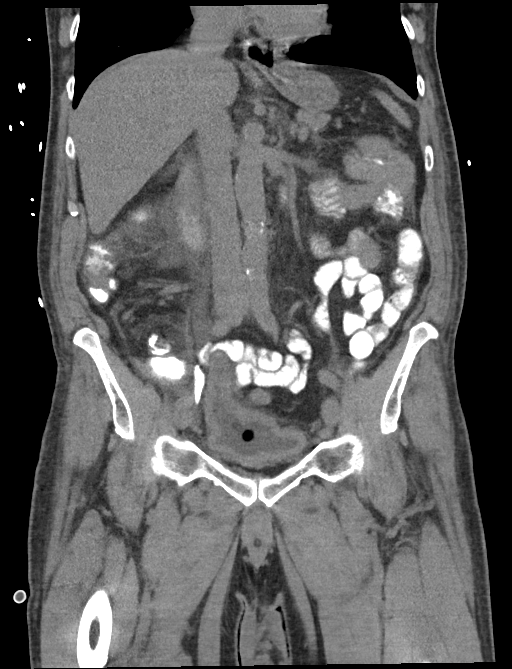
[im 54/97  soft-tissue]
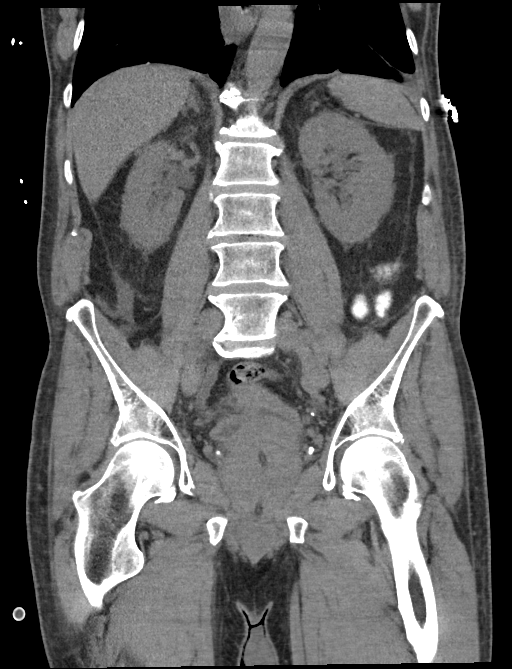

[15 of 46 positions shown; findings below may reference images not displayed]

FINDINGS: Lower chest: Trace bilateral pleural effusions with dependent
bibasilar opacities. Asymmetric wall thickening of the distal
esophagus (series 2, image 4). Heart size is normal.

Hepatobiliary: Unremarkable unenhanced appearance of the liver. No
focal liver lesion identified. Gallbladder within normal limits. No
hyperdense gallstone. No biliary dilatation.

Pancreas: Unremarkable. No pancreatic ductal dilatation or
surrounding inflammatory changes.

Spleen: Normal in size without focal abnormality.

Adrenals/Urinary Tract: Unremarkable adrenal glands. Mild bilateral
hydroureteronephrosis. No renal or ureteral calculi are identified.
Foley catheter is present within the bladder lumen. Prominent
circumferential urinary bladder wall thickening.

Stomach/Bowel: Stomach within normal limits. Oral contrast is
present throughout the small bowel and proximal colon. No dilated
loops of bowel. A normal appendix is present in the right lower
quadrant (series 5, image 44). No focal bowel wall thickening or
inflammatory changes are seen. A small loop of colon extends into
patient's left inguinal hernia.

Vascular/Lymphatic: Scattered aortoiliac atherosclerotic
calcifications without aneurysm. No abdominopelvic lymphadenopathy.

Reproductive: Prostatomegaly.

Other: Small volume free fluid within the right pericolic gutter and
right pelvis. No organized abdominopelvic fluid collections. No
pneumoperitoneum. Bilateral inguinal hernias, fat containing on the
right, and bowel containing on the left.

Musculoskeletal: No acute or significant osseous findings.
IMPRESSION: 1. Prominent circumferential urinary bladder wall thickening, may
represent cystitis or changes related to chronic outlet obstruction.
Correlate with urinalysis.
2. Mild bilateral hydroureteronephrosis without renal or ureteral
calculi identified.
3. Small volume free fluid within the right pericolic gutter and
right pelvis.
4. Trace bilateral pleural effusions with dependent bibasilar
opacities, which may represent atelectasis and/or pneumonia.
5. Asymmetric wall thickening of the distal esophagus, which could
represent esophagitis or neoplasm. Recommend further evaluation with
nonemergent upper endoscopy.
6. Bilateral inguinal hernias, fat containing on the right, and
bowel containing on the left. No bowel obstruction.
7. Prostatomegaly.
8. Aortic atherosclerosis ([id number].M).

## 2025-01-24 ENCOUNTER — Ambulatory Visit: Admitting: Urology
